# Patient Record
Sex: Female | Born: 1955 | Race: White | Hispanic: No | Marital: Married | Smoking: Former smoker
Health system: Southern US, Community
[De-identification: ages and names within clinical notes are randomized; demographics above are authoritative.]

## PROBLEM LIST (undated history)

## (undated) DIAGNOSIS — G43909 Migraine, unspecified, not intractable, without status migrainosus: Secondary | ICD-10-CM

## (undated) DIAGNOSIS — G459 Transient cerebral ischemic attack, unspecified: Secondary | ICD-10-CM

## (undated) DIAGNOSIS — C50919 Malignant neoplasm of unspecified site of unspecified female breast: Secondary | ICD-10-CM

## (undated) DIAGNOSIS — I10 Essential (primary) hypertension: Secondary | ICD-10-CM

## (undated) DIAGNOSIS — H43399 Other vitreous opacities, unspecified eye: Secondary | ICD-10-CM

## (undated) DIAGNOSIS — D1800 Hemangioma unspecified site: Secondary | ICD-10-CM

## (undated) DIAGNOSIS — K5190 Ulcerative colitis, unspecified, without complications: Secondary | ICD-10-CM

## (undated) DIAGNOSIS — E079 Disorder of thyroid, unspecified: Secondary | ICD-10-CM

## (undated) DIAGNOSIS — R7989 Other specified abnormal findings of blood chemistry: Secondary | ICD-10-CM

## (undated) DIAGNOSIS — G47419 Narcolepsy without cataplexy: Secondary | ICD-10-CM

## (undated) DIAGNOSIS — E785 Hyperlipidemia, unspecified: Secondary | ICD-10-CM

## (undated) DIAGNOSIS — Z923 Personal history of irradiation: Secondary | ICD-10-CM

## (undated) DIAGNOSIS — E538 Deficiency of other specified B group vitamins: Secondary | ICD-10-CM

## (undated) HISTORY — PX: BREAST LUMPECTOMY: SHX2

## (undated) HISTORY — PX: HAND SURGERY: SHX662

## (undated) HISTORY — DX: Hyperlipidemia, unspecified: E78.5

## (undated) HISTORY — PX: ABDOMINAL HERNIA REPAIR: SHX539

## (undated) HISTORY — DX: Essential (primary) hypertension: I10

## (undated) HISTORY — DX: Disorder of thyroid, unspecified: E07.9

## (undated) HISTORY — DX: Hemangioma unspecified site: D18.00

## (undated) HISTORY — PX: ANTERIOR CRUCIATE LIGAMENT REPAIR: SHX115

## (undated) HISTORY — DX: Personal history of irradiation: Z92.3

## (undated) HISTORY — DX: Migraine, unspecified, not intractable, without status migrainosus: G43.909

## (undated) HISTORY — PX: BUNIONECTOMY: SHX129

## (undated) HISTORY — DX: Other vitreous opacities, unspecified eye: H43.399

## (undated) HISTORY — PX: CARPAL TUNNEL WITH CUBITAL TUNNEL: SHX5608

## (undated) HISTORY — PX: CARPAL TUNNEL RELEASE: SHX101

## (undated) HISTORY — PX: DILATION AND CURETTAGE OF UTERUS: SHX78

## (undated) HISTORY — PX: NECK SURGERY: SHX720

## (undated) HISTORY — PX: ACHILLES TENDON LENGTHENING: SUR826

## (undated) HISTORY — DX: Transient cerebral ischemic attack, unspecified: G45.9

## (undated) HISTORY — PX: LIPOMA EXCISION: SHX5283

## (undated) HISTORY — DX: Other specified abnormal findings of blood chemistry: R79.89

## (undated) HISTORY — DX: Ulcerative colitis, unspecified, without complications: K51.90

## (undated) HISTORY — DX: Deficiency of other specified B group vitamins: E53.8

## (undated) HISTORY — PX: LUMBAR LAMINECTOMY: SHX95

## (undated) HISTORY — DX: Malignant neoplasm of unspecified site of unspecified female breast: C50.919

## (undated) HISTORY — DX: Narcolepsy without cataplexy: G47.419

## (undated) HISTORY — PX: BIOPSY THYROID: PRO38

## (undated) HISTORY — PX: HAMMER TOE SURGERY: SHX385

---

## 2015-05-23 LAB — HM COLONOSCOPY: HM COLON: ABNORMAL — AB

## 2015-06-16 LAB — BASIC METABOLIC PANEL
BUN: 28 mg/dL — AB (ref 4–21)
Creatinine: 1.1 mg/dL (ref 0.5–1.1)
Glucose: 91 mg/dL
Potassium: 4.3 mmol/L (ref 3.4–5.3)
SODIUM: 141 mmol/L (ref 137–147)

## 2015-06-16 LAB — LIPID PANEL
CHOLESTEROL: 262 mg/dL — AB (ref 0–200)
HDL: 76 mg/dL — AB (ref 35–70)
LDL CALC: 170 mg/dL
TRIGLYCERIDES: 82 mg/dL (ref 40–160)

## 2015-06-16 LAB — CBC AND DIFFERENTIAL
HCT: 42 % (ref 36–46)
HEMOGLOBIN: 13.8 g/dL (ref 12.0–16.0)
Platelets: 281 10*3/uL (ref 150–399)

## 2015-06-16 LAB — HEPATIC FUNCTION PANEL
ALT: 15 U/L (ref 7–35)
AST: 20 U/L (ref 13–35)
Alkaline Phosphatase: 171 U/L — AB (ref 25–125)

## 2016-02-09 ENCOUNTER — Encounter: Payer: Self-pay | Admitting: *Deleted

## 2016-02-09 ENCOUNTER — Telehealth: Payer: Self-pay | Admitting: *Deleted

## 2016-02-09 NOTE — Telephone Encounter (Signed)
Pre-Visit Call completed with patient and chart updated.   Pre-Visit Info documented in Specialty Comments under Snap[employer]ot.    Whitney Fisher, you may want to look over info prior to appointment.

## 2016-02-10 ENCOUNTER — Encounter: Payer: Self-pay | Admitting: Physician Assistant

## 2016-02-10 ENCOUNTER — Ambulatory Visit (INDEPENDENT_AMBULATORY_CARE_PROVIDER_SITE_OTHER): Payer: Medicare Other | Admitting: Physician Assistant

## 2016-02-10 VITALS — BP 150/90 | HR 74 | Temp 98.0°F | Resp 16 | Ht 64.0 in | Wt 162.1 lb

## 2016-02-10 DIAGNOSIS — K5120 Ulcerative (chronic) proctitis without complications: Secondary | ICD-10-CM

## 2016-02-10 DIAGNOSIS — M150 Primary generalized (osteo)arthritis: Secondary | ICD-10-CM

## 2016-02-10 DIAGNOSIS — Z853 Personal history of malignant neoplasm of breast: Secondary | ICD-10-CM

## 2016-02-10 DIAGNOSIS — K5190 Ulcerative colitis, unspecified, without complications: Secondary | ICD-10-CM

## 2016-02-10 DIAGNOSIS — H539 Unspecified visual disturbance: Secondary | ICD-10-CM | POA: Diagnosis not present

## 2016-02-10 DIAGNOSIS — M159 Polyosteoarthritis, unspecified: Secondary | ICD-10-CM

## 2016-02-10 DIAGNOSIS — E049 Nontoxic goiter, unspecified: Secondary | ICD-10-CM

## 2016-02-10 DIAGNOSIS — Z1283 Encounter for screening for malignant neoplasm of skin: Secondary | ICD-10-CM

## 2016-02-10 DIAGNOSIS — G43709 Chronic migraine without aura, not intractable, without status migrainosus: Secondary | ICD-10-CM

## 2016-02-10 DIAGNOSIS — Z0289 Encounter for other administrative examinations: Secondary | ICD-10-CM

## 2016-02-10 DIAGNOSIS — Z7689 Persons encountering health services in other specified circumstances: Secondary | ICD-10-CM

## 2016-02-10 DIAGNOSIS — I10 Essential (primary) hypertension: Secondary | ICD-10-CM

## 2016-02-10 DIAGNOSIS — G47419 Narcolepsy without cataplexy: Secondary | ICD-10-CM

## 2016-02-10 DIAGNOSIS — E010 Iodine-deficiency related diffuse (endemic) goiter: Secondary | ICD-10-CM

## 2016-02-10 LAB — COMPREHENSIVE METABOLIC PANEL
ALK PHOS: 158 U/L — AB (ref 39–117)
ALT: 13 U/L (ref 0–35)
AST: 15 U/L (ref 0–37)
Albumin: 4.1 g/dL (ref 3.5–5.2)
BILIRUBIN TOTAL: 0.4 mg/dL (ref 0.2–1.2)
BUN: 21 mg/dL (ref 6–23)
CALCIUM: 9.2 mg/dL (ref 8.4–10.5)
CO2: 26 mEq/L (ref 19–32)
Chloride: 107 mEq/L (ref 96–112)
Creatinine, Ser: 0.83 mg/dL (ref 0.40–1.20)
GFR: 74.98 mL/min (ref >60.00)
Glucose, Bld: 102 mg/dL — ABNORMAL HIGH (ref 70–99)
POTASSIUM: 3.8 meq/L (ref 3.5–5.1)
Sodium: 137 mEq/L (ref 135–145)
TOTAL PROTEIN: 7.7 g/dL (ref 6.0–8.3)

## 2016-02-10 LAB — TSH: TSH: 2.49 u[IU]/mL (ref 0.35–4.50)

## 2016-02-10 MED ORDER — BUTALBITAL-APAP-CAFFEINE 50-325-40 MG PO TABS: 1.0000 | ORAL_TABLET | ORAL | Status: DC | PRN

## 2016-02-10 MED ORDER — AMLODIPINE BESYLATE 5 MG PO TABS: 5.0000 mg | ORAL_TABLET | Freq: Every day | ORAL | Status: DC

## 2016-02-10 NOTE — Progress Notes (Signed)
Pre visit review using our clinic review tool, if applicable. No additional management support is needed unless otherwise documented below in the visit note/SLS  

## 2016-02-10 NOTE — Patient Instructions (Signed)
Please go to the lab for blood work. I will call with your results.  Please restart the amlodipine as idrected.  Follow the diet at the bottom of this sheet. Follow-up with me in 3-4 weeks.  You will be contacted by multiple specialists for appointments. You will also be contacted for US of the thyroid as well as mammogram and US of the breast.  DASH Eating Plan DASH stands for "Dietary Approaches to Stop Hypertension." The DASH eating plan is a healthy eating plan that has been shown to reduce high blood pressure (hypertension). Additional health benefits may include reducing the risk of type 2 diabetes mellitus, heart disease, and stroke. The DASH eating plan may also help with weight loss. WHAT DO I NEED TO KNOW ABOUT THE DASH EATING PLAN? For the DASH eating plan, you will follow these general guidelines:  Choose foods with a percent daily value for sodium of less than 5% (as listed on the food label).  Use salt-free seasonings or herbs instead of table salt or sea salt.  Check with your health care provider or pharmacist before using salt substitutes.  Eat lower-sodium products, often labeled as "lower sodium" or "no salt added."  Eat fresh foods.  Eat more vegetables, fruits, and low-fat dairy products.  Choose whole grains. Look for the word "whole" as the first word in the ingredient list.  Choose fish and skinless chicken or [place] more often than red meat. Limit fish, poultry, and meat to 6 oz (170 g) each day.  Limit sweets, desserts, sugars, and sugary drinks.  Choose heart-healthy fats.  Limit cheese to 1 oz (28 g) per day.  Eat more home-cooked food and less restaurant, buffet, and fast food.  Limit fried foods.  Cook foods using methods other than frying.  Limit canned vegetables. If you do use them, rinse them well to decrease the sodium.  When eating at a restaurant, ask that your food be prepared with less salt, or no salt if possible. WHAT FOODS CAN I  EAT? Seek help from a dietitian for individual calorie needs. Grains Whole grain or whole wheat bread. Brown rice. Whole grain or whole wheat pasta. Quinoa, bulgur, and whole grain cereals. Low-sodium cereals. Corn or whole wheat flour tortillas. Whole grain cornbread. Whole grain crackers. Low-sodium crackers. Vegetables Fresh or frozen vegetables (raw, steamed, roasted, or grilled). Low-sodium or reduced-sodium tomato and vegetable juices. Low-sodium or reduced-sodium tomato sauce and paste. Low-sodium or reduced-sodium canned vegetables.  Fruits All fresh, canned (in natural juice), or frozen fruits. Meat and Other Protein Products Ground beef (85% or leaner), grass-fed beef, or beef trimmed of fat. Skinless chicken or [place]. Ground chicken or [place]. Pork trimmed of fat. All fish and seafood. Eggs. Dried beans, peas, or lentils. Unsalted nuts and seeds. Unsalted canned beans. Dairy Low-fat dairy products, such as skim or 1% milk, 2% or reduced-fat cheeses, low-fat ricotta or cottage cheese, or plain low-fat yogurt. Low-sodium or reduced-sodium cheeses. Fats and Oils Tub margarines without trans fats. Light or reduced-fat mayonnaise and salad dressings (reduced sodium). Avocado. Safflower, olive, or canola oils. Natural peanut or almond butter. Other Unsalted popcorn and pretzels. The items listed above may not be a complete list of recommended foods or beverages. Contact your dietitian for more options. WHAT FOODS ARE NOT RECOMMENDED? Grains White bread. White pasta. White rice. Refined cornbread. Bagels and croissants. Crackers that contain trans fat. Vegetables Creamed or fried vegetables. Vegetables in a cheese sauce. Regular canned vegetables. Regular canned tomato sauce  and paste. Regular tomato and vegetable juices. Fruits Dried fruits. Canned fruit in light or heavy syrup. Fruit juice. Meat and Other Protein Products Fatty cuts of meat. Ribs, chicken wings, bacon, sausage,  bologna, salami, chitterlings, fatback, hot dogs, bratwurst, and packaged luncheon meats. Salted nuts and seeds. Canned beans with salt. Dairy Whole or 2% milk, cream, half-and-half, and cream cheese. Whole-fat or sweetened yogurt. Full-fat cheeses or blue cheese. Nondairy creamers and whipped toppings. Processed cheese, cheese spreads, or cheese curds. Condiments Onion and garlic salt, seasoned salt, table salt, and sea salt. Canned and packaged gravies. Worcestershire sauce. Tartar sauce. Barbecue sauce. Teriyaki sauce. Soy sauce, including reduced sodium. Steak sauce. Fish sauce. Oyster sauce. Cocktail sauce. Horseradish. Ketchup and mustard. Meat flavorings and tenderizers. Bouillon cubes. Hot sauce. Tabasco sauce. Marinades. Taco seasonings. Relishes. Fats and Oils Butter, stick margarine, lard, shortening, ghee, and bacon fat. Coconut, palm kernel, or palm oils. Regular salad dressings. Other Pickles and olives. Salted popcorn and pretzels. The items listed above may not be a complete list of foods and beverages to avoid. Contact your dietitian for more information. WHERE CAN I FIND MORE INFORMATION? National Heart, Lung, and Blood Institute: travelstabloid.com   This information is not intended to replace advice given to you by your health care provider. Make sure you discuss any questions you have with your health care provider.   Document Released: 07/28/2011 Document Revised: 08/29/2014 Document Reviewed: 06/12/2013 Elsevier Interactive Patient Education [employer].

## 2016-02-10 NOTE — Progress Notes (Signed)
Patient presents to clinic today to establish care.  Acute Concerns: Patient requests routine referral to Gynecology (routine care) and Dermatology (comprehensive skin examination). Denies concerns at this time.  Chronic Issues: Ulcerative Colitis -- Patient endorses initial diagnosis in 1995. Was previously on Mesalamine in [place] with good relief of symptoms. Has not been on medication in several months before [place] here [place] the states. Endorses no flare up of symptoms since moving and resuming a regular diet. Denies rectal bleeding, mucous stools, unexplained weight loss. Denies N/V, abdomina pain at present. Endorses last colonoscopy in October of 2016 revealing proctitis at the time. No concern for malignancy per patient. Is requesting Gastroenterologist in the area after [place] back [place] the states.  Migraines -- Patient with prior history of daily migraines with aura. Is currently on Topamax 100 mg QD and Effexor 75 mg BID. Endorses sub-therapeutic control of migraines recently but had been out of medication for a couple of weeks while unpacking. Denies symptoms at present. Is requesting referral to Neurology in the area.  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 Denies history of abnormal thyroid function. Was told she had nodules on thyroid confirmed via [place]. Was scheduled for biopsy but did not follow-up. Would like to proceed with assessment now.   Breast Cancer -- Patient endorses remote history of breast cancer in remission for several years. Is overdue for mammogram and would like scheduled. Is also requesting referral to Oncology (Dr. Marin Olp). Denies breast pain or lump in the area. Is s/p lumpectomy of R breast at age 16. Denies history of mastectomy.  Osteoarthritis -- Multiple joints. Long-standing history. Does not like narcotic pain medications. Wants to be set up with a comprehensive pain specialist in the area. Is having prior  records faxed to our office.  Past Medical History:  Diagnosis Date  . B12 deficiency   . Breast cancer ([place])    Right Breast  . Cavernous hemangioma    x2  . Hyperlipidemia   . Hypertension   . Low serum vitamin D   . Migraine   . Narcolepsy   . S/P radiation therapy Age [age]  . Thyroid disease   . TIA (transient ischemic attack)   . Ulcerative colitis ([place])   . Visual floaters     Past Surgical History:  Procedure Laterality Date  . ABDOMINAL HERNIA REPAIR    . ACHILLES TENDON LENGTHENING Left   . ANTERIOR CRUCIATE LIGAMENT REPAIR Right   . BIOPSY THYROID     x2  . BREAST LUMPECTOMY Right Age 56  . BUNIONECTOMY Bilateral   . CARPAL TUNNEL RELEASE Bilateral   . CARPAL TUNNEL WITH CUBITAL TUNNEL Right   . [place]  . DILATION AND CURETTAGE OF UTERUS     After 1st miscarriage  . HAMMER TOE SURGERY Bilateral   . HAND SURGERY Right    Multiple Right & Left tfcc  . LIPOMA EXCISION  Age 75  . LUMBAR LAMINECTOMY  Age 66-30  . NECK SURGERY      Current Outpatient Prescriptions on File Prior to Visit  Medication Sig Dispense Refill  . clopidogrel (PLAVIX) 75 MG tablet Take 75 mg by mouth daily. Reported on 03/03/2016     No current facility-administered medications on file prior to visit.     Allergies  Allergen Reactions  . Codeine Other (See Comments)    Dizziness  . Adhesive [Tape] Rash  . Sulfa Antibiotics Rash  Family History  Problem Relation Age of Onset  . Breast cancer Mother     Living  . Arthritis Mother   . Osteoporosis Mother   . Migraines Mother   . Glaucoma Maternal Grandmother   . Hypertension Father 74    Deceased  . Hyperlipidemia Father   . Parkinson's disease Father   . Parkinsonism Father   . Colon cancer Neg Hx   . Migraines Sister     Social History   Social History  . Marital status: Married    Spouse name: N/A  . Number of children: 2  . Years of education: N/A   Occupational History  . Disabled     Social History Main Topics  . Smoking status: Former Smoker    Types: Cigarettes    Quit date: 08/22/1978  . Smokeless tobacco: Never Used  . Alcohol use 0.0 oz/week     Comment: occ  . Drug use: No  . Sexual activity: Not on file   Other Topics Concern  . Not on file   Social History Narrative  . No narrative on file   ROS  BP (!) 150/90   Pulse 74   Temp 98 F (36.7 C) (Oral)   Resp 16   Ht 5' 4"  (1.626 m)   Wt 162 lb 2 oz (73.5 kg)   SpO2 100%   BMI 27.83 kg/m   Physical Exam  Recent Results (from the past 2160 hour(s))  Comp Met (CMET)     Status: Abnormal   Collection Time: 02/10/16  9:49 AM  Result Value Ref Range   Sodium 137 135 - 145 mEq/L   Potassium 3.8 3.5 - 5.1 mEq/L   Chloride 107 96 - 112 mEq/L   CO2 26 19 - 32 mEq/L   Glucose, Bld 102 (H) 70 - 99 mg/dL   BUN 21 6 - 23 mg/dL   Creatinine, Ser 0.83 0.40 - 1.20 mg/dL   Total Bilirubin 0.4 0.2 - 1.2 mg/dL   Alkaline Phosphatase 158 (H) 39 - 117 U/L   AST 15 0 - 37 U/L   ALT 13 0 - 35 U/L   Total Protein 7.7 6.0 - 8.3 g/dL   Albumin 4.1 3.5 - 5.2 g/dL   Calcium 9.2 8.4 - 10.5 mg/dL   GFR 74.98 >60.00 mL/min  TSH     Status: None   Collection Time: 02/10/16  9:49 AM  Result Value Ref Range   TSH 2.49 0.35 - 4.50 uIU/mL  Comp Met (CMET)     Status: Abnormal   Collection Time: 02/12/16  3:42 PM  Result Value Ref Range   Sodium 142 135 - 146 mmol/L   Potassium 5.1 3.5 - 5.3 mmol/L   Chloride 107 98 - 110 mmol/L   CO2 23 20 - 31 mmol/L   Glucose, Bld 89 65 - 99 mg/dL   BUN 12 7 - 25 mg/dL   Creat 0.82 0.50 - 1.05 mg/dL    Comment:   For patients > or = 58 years of age: The upper reference limit for Creatinine is approximately 13% higher for people identified as African-American.      Total Bilirubin 0.3 0.2 - 1.2 mg/dL   Alkaline Phosphatase 157 (H) 33 - 130 U/L   AST 15 10 - 35 U/L   ALT 13 6 - 29 U/L   Total Protein 7.3 6.1 - 8.1 g/dL   Albumin 4.1 3.6 - 5.1 g/dL   Calcium 9.0 8.6  -  10.4 mg/dL  Lipase     Status: None   Collection Time: 02/12/16  3:42 PM  Result Value Ref Range   Lipase 19 7 - 60 U/L  CBC with Differential/Platelet     Status: None   Collection Time: 02/16/16  4:00 PM  Result Value Ref Range   WBC 8.0 4.0 - 10.5 K/uL   RBC 4.60 3.87 - 5.11 Mil/uL   Hemoglobin 13.1 12.0 - 15.0 g/dL   HCT 39.1 36.0 - 46.0 %   MCV 85.0 78.0 - 100.0 fl   MCHC 33.5 30.0 - 36.0 g/dL   RDW 13.6 11.5 - 15.5 %   Platelets 320.0 150.0 - 400.0 K/uL   Neutrophils Relative % 60.6 43.0 - 77.0 %   Lymphocytes Relative 27.9 12.0 - 46.0 %   Monocytes Relative 6.9 3.0 - 12.0 %   Eosinophils Relative 3.8 0.0 - 5.0 %   Basophils Relative 0.8 0.0 - 3.0 %   Neutro Abs 4.9 1.4 - 7.7 K/uL   Lymphs Abs 2.2 0.7 - 4.0 K/uL   Monocytes Absolute 0.6 0.1 - 1.0 K/uL   Eosinophils Absolute 0.3 0.0 - 0.7 K/uL   Basophils Absolute 0.1 0.0 - 0.1 K/uL  Sed Rate (ESR)     Status: None   Collection Time: 02/16/16  4:00 PM  Result Value Ref Range   Sed Rate 8 0 - 30 mm/hr  C-reactive protein     Status: Abnormal   Collection Time: 02/16/16  4:00 PM  Result Value Ref Range   CRP 0.1 (L) 0.5 - 20.0 mg/dL  Gamma GT     Status: None   Collection Time: 02/16/16  4:00 PM  Result Value Ref Range   GGT 27 7 - 51 U/L  VITAMIN D 25 Hydroxy (Vit-D Deficiency, Fractures)     Status: None   Collection Time: 02/18/16  3:22 PM  Result Value Ref Range   VITD 31.40 30.00 - 100.00 ng/mL  Cytology - PAP     Status: None   Collection Time: 03/03/16 12:00 AM  Result Value Ref Range   CYTOLOGY - PAP PAP RESULT     Assessment/Plan: Chronic migraine without aura without status migrainosus, not intractable Referral to Neurology placed. Medications refilled. She is to continue her current regimen as she recently restarted after finding medication in moving boxes.   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 Referral to Gastroenterology placed for management and to set up schedule for  repeat colonoscopies.  Thyromegaly No palpable nodules on examination. Will check thyroid function and obtain US thyroid. Will then proceed with biopsy if indicated.  Primary osteoarthritis involving multiple joints Rx Tramadol for breakthrough pain. OTC medications reviewed. Supportive measures and water aerobics discussed. Referral to pain specialist placed.  Skin exam for malignant neoplasm Routine referral to Dermatology placed.  Referral of patient Referral to Gynecology for routine care placed.  History of breast cancer Referral to oncology placed. Will obtain imaging of breasts for surveillance. Orders placed.    Leeanne Rio, PA-C

## 2016-02-12 ENCOUNTER — Ambulatory Visit (INDEPENDENT_AMBULATORY_CARE_PROVIDER_SITE_OTHER): Payer: Medicare Other | Admitting: Physician Assistant

## 2016-02-12 ENCOUNTER — Other Ambulatory Visit: Payer: Self-pay | Admitting: Physician Assistant

## 2016-02-12 ENCOUNTER — Ambulatory Visit (HOSPITAL_BASED_OUTPATIENT_CLINIC_OR_DEPARTMENT_OTHER)
Admission: RE | Admit: 2016-02-12 | Discharge: 2016-02-12 | Disposition: A | Discharge status: Home / Self Care | Payer: Medicare Other | Source: Ambulatory Visit | Attending: Physician Assistant | Admitting: Physician Assistant

## 2016-02-12 ENCOUNTER — Encounter: Payer: Self-pay | Admitting: Physician Assistant

## 2016-02-12 VITALS — BP 151/98 | HR 70 | Temp 98.2°F | Resp 16 | Ht 64.0 in | Wt 163.0 lb

## 2016-02-12 DIAGNOSIS — K769 Liver disease, unspecified: Secondary | ICD-10-CM | POA: Diagnosis not present

## 2016-02-12 DIAGNOSIS — R748 Abnormal levels of other serum enzymes: Secondary | ICD-10-CM

## 2016-02-12 DIAGNOSIS — R1013 Epigastric pain: Secondary | ICD-10-CM

## 2016-02-12 DIAGNOSIS — R140 Abdominal distension (gaseous): Secondary | ICD-10-CM | POA: Diagnosis not present

## 2016-02-12 LAB — COMPREHENSIVE METABOLIC PANEL
ALBUMIN: 4.1 g/dL (ref 3.6–5.1)
ALK PHOS: 157 U/L — AB (ref 33–130)
ALT: 13 U/L (ref 6–29)
AST: 15 U/L (ref 10–35)
BILIRUBIN TOTAL: 0.3 mg/dL (ref 0.2–1.2)
BUN: 12 mg/dL (ref 7–25)
CALCIUM: 9 mg/dL (ref 8.6–10.4)
CO2: 23 mmol/L (ref 20–31)
Chloride: 107 mmol/L (ref 98–110)
Creat: 0.82 mg/dL (ref 0.50–1.05)
Glucose, Bld: 89 mg/dL (ref 65–99)
Potassium: 5.1 mmol/L (ref 3.5–5.3)
Sodium: 142 mmol/L (ref 135–146)
Total Protein: 7.3 g/dL (ref 6.1–8.1)

## 2016-02-12 LAB — LIPASE: Lipase: 19 U/L (ref 7–60)

## 2016-02-12 MED ORDER — ROSUVASTATIN CALCIUM 10 MG PO TABS: 10.0000 mg | ORAL_TABLET | Freq: Every day | ORAL | Status: DC

## 2016-02-12 MED ORDER — VENLAFAXINE HCL 75 MG PO TABS: 75.0000 mg | ORAL_TABLET | Freq: Two times a day (BID) | ORAL | Status: DC

## 2016-02-12 MED ORDER — TOPIRAMATE 100 MG PO TABS: 100.0000 mg | ORAL_TABLET | Freq: Two times a day (BID) | ORAL | Status: DC

## 2016-02-12 NOTE — Patient Instructions (Addendum)
Please go to the lab for blood work. Then go downstairs for imaging.   I will call you with your results. They will call me with the [place] results before letting you leave the imaging department.  Stay hydrated and eat a bland diet. Tylenol for pain. If anything acutely worsens, please go to the ER.

## 2016-02-12 NOTE — Progress Notes (Signed)
Patient presents to clinic today for follow-up of alkaline phosphatase elevation. Does endorse some epigastric and RUQ discomfort over the past several months, but denies pain. Notes intermittent nausea but denies emesis. Denies change to bowel or urinary habits.  Denies fever, chills, malaise or fatigue. Denies history of gallstones or biliary issue.  Past Medical History  Diagnosis Date  . Ulcerative colitis ([place])   . Migraine   . S/P radiation therapy Age 20-48  . Hypertension   . Narcolepsy   . TIA (transient ischemic attack)   . Breast cancer ([place])   . History of breast cancer   . Hyperlipidemia   . Low serum vitamin D   . B12 deficiency   . Thyroid disease   . Cavernous hemangioma   . Visual floaters     Current Outpatient Prescriptions on File Prior to Visit  Medication Sig Dispense Refill  . amLODipine (NORVASC) 5 MG tablet Take 1 tablet (5 mg total) by mouth daily. Reported on 02/10/2016 30 tablet 1  . butalbital-acetaminophen-caffeine (FIORICET, ESGIC) 50-325-40 MG tablet Take 1 tablet by mouth as needed for headache. 20 tablet 0  . clopidogrel (PLAVIX) 75 MG tablet Take 75 mg by mouth daily. Reported on 02/10/2016    . Mesalamine (ASACOL PO) Take by mouth 2 (two) times daily.    . rosuvastatin (CRESTOR) 10 MG tablet Take 10 mg by mouth daily. Reported on 02/10/2016    . topiramate (TOPAMAX) 100 MG tablet Take 100 mg by mouth 2 (two) times daily.    Marland Kitchen venlafaxine (EFFEXOR) 75 MG tablet Take 75 mg by mouth 2 (two) times daily.     No current facility-administered medications on file prior to visit.    Allergies  Allergen Reactions  . Codeine Other (See Comments)    Dizziness  . Adhesive [Tape] Rash  . Sulfa Antibiotics Rash    Family History  Problem Relation Age of Onset  . Breast cancer Mother     Living  . Arthritis Mother   . Osteoporosis Mother   . Glaucoma Maternal Grandmother   . Hypertension Father 52    Deceased  . Hyperlipidemia Father   .  Parkinson's disease Father     Social History   Social History  . Marital Status: Married    Spouse Name: N/A  . Number of Children: N/A  . Years of Education: N/A   Social History Main Topics  . Smoking status: Former Smoker    Quit date: 08/22/1978  . Smokeless tobacco: None  . Alcohol Use: None  . Drug Use: None  . Sexual Activity: Not Asked   Other Topics Concern  . None   Social History Narrative   Review of Systems - See HPI.  All other ROS are negative.  BP 151/98 mmHg  Pulse 70  Temp(Src) 98.2 F (36.8 C) (Oral)  Resp 16  Ht 5' 4"  (1.626 m)  Wt 163 lb (73.936 kg)  BMI 27.97 kg/m2  SpO2 100%  Physical Exam  Constitutional: She is oriented to person, place, and time and well-developed, well-nourished, and in no distress.  HENT:  Head: Normocephalic and atraumatic.  Eyes: Conjunctivae are normal.  Neck: Neck supple.  Cardiovascular: Normal rate, regular rhythm, normal heart sounds and intact distal pulses.   Pulmonary/Chest: Effort normal and breath sounds normal. No respiratory distress. She has no wheezes. She has no rales. She exhibits no tenderness.  Abdominal: Soft. Bowel sounds are normal. She exhibits no distension and no mass. There  is no hepatosplenomegaly. There is tenderness in the epigastric area. There is no rebound, no guarding and negative Murphy's sign. No hernia.  Neurological: She is alert and oriented to person, place, and time.  Skin: Skin is warm and dry. No rash noted.  Psychiatric: Affect normal.  Vitals reviewed.   Recent Results (from the past 2160 hour(s))  Comp Met (CMET)     Status: Abnormal   Collection Time: 02/10/16  9:49 AM  Result Value Ref Range   Sodium 137 135 - 145 mEq/L   Potassium 3.8 3.5 - 5.1 mEq/L   Chloride 107 96 - 112 mEq/L   CO2 26 19 - 32 mEq/L   Glucose, Bld 102 (H) 70 - 99 mg/dL   BUN 21 6 - 23 mg/dL   Creatinine, Ser 0.83 0.40 - 1.20 mg/dL   Total Bilirubin 0.4 0.2 - 1.2 mg/dL   Alkaline Phosphatase  158 (H) 39 - 117 U/L   AST 15 0 - 37 U/L   ALT 13 0 - 35 U/L   Total Protein 7.7 6.0 - 8.3 g/dL   Albumin 4.1 3.5 - 5.2 g/dL   Calcium 9.2 8.4 - 10.5 mg/dL   GFR 74.98 >60.00 mL/min  TSH     Status: None   Collection Time: 02/10/16  9:49 AM  Result Value Ref Range   TSH 2.49 0.35 - 4.50 uIU/mL    Assessment/Plan: 1. Epigastric discomfort Will check lab panel today. STAT US ordered for further assessment of symptoms. Question some biliary dysfunction as her history, time frame and current symptoms do not fit a typical acute cholecystitis or choledocolithiasis. Supportive measures reviewed.  - US Abdomen Limited RUQ; Future - Comp Met (CMET) - Lipase  2. Elevated alkaline phosphatase level Will check labs and STAT US today giving she has had some mild pain for several months. No alarm signs/symptoms present but these have been reviewed with patient. ER if these occur before workup is complete. - US Abdomen Limited RUQ; Future - Comp Met (CMET) - Lipase   Leeanne Rio, PA-C

## 2016-02-15 ENCOUNTER — Encounter: Payer: Self-pay | Admitting: *Deleted

## 2016-02-15 ENCOUNTER — Telehealth: Payer: Self-pay | Admitting: *Deleted

## 2016-02-15 DIAGNOSIS — R748 Abnormal levels of other serum enzymes: Secondary | ICD-10-CM

## 2016-02-15 DIAGNOSIS — R1013 Epigastric pain: Secondary | ICD-10-CM

## 2016-02-15 NOTE — Telephone Encounter (Signed)
Patient informed [explained hemangiomas and reasoning for GTT lab test], understood & agreed order placed for MRI and future lab orders placed, pt scheduled for lab on Thurs, 02/18/16 at 8:45am/SLS 06/26

## 2016-02-15 NOTE — Telephone Encounter (Signed)
-----  Message from Brunetta Jeans, PA-C sent at 02/13/2016  7:53 AM EDT ----- Please inform patient that her alk phos is still elevated but is unchanged from last check. Again as discussed with her Friday evening, her [place] was unremarkable for gallbladder or biliary abnormality. Did show likely hemangiomas which are not harmful. We can get an MRI to further assess if she would like.  Giving the persistent elevation inf her enzyme but unremarkable [place], would recommend she come in for a GGT level. This will help me differentiate if this elevation is still related to the liver and biliary tract or for another reason (bone-related). Would also like to check a Vitamin D level on her.  Please schedule her a lab appointment. Also verify that she received her prescriptions this weekend -- sent in a couple and phoned in an Rx for Tramadol for her Friday evening. Is symptoms are becoming more significant, she will need to see one of my colleagues in office this week.

## 2016-02-16 ENCOUNTER — Encounter: Payer: Self-pay | Admitting: Gastroenterology

## 2016-02-16 ENCOUNTER — Other Ambulatory Visit (INDEPENDENT_AMBULATORY_CARE_PROVIDER_SITE_OTHER): Payer: Medicare Other

## 2016-02-16 ENCOUNTER — Telehealth: Payer: Self-pay | Admitting: *Deleted

## 2016-02-16 ENCOUNTER — Ambulatory Visit (INDEPENDENT_AMBULATORY_CARE_PROVIDER_SITE_OTHER): Payer: Medicare Other | Admitting: Gastroenterology

## 2016-02-16 VITALS — BP 124/70 | HR 72 | Ht 63.0 in | Wt 161.0 lb

## 2016-02-16 DIAGNOSIS — K5120 Ulcerative (chronic) proctitis without complications: Secondary | ICD-10-CM | POA: Insufficient documentation

## 2016-02-16 DIAGNOSIS — R748 Abnormal levels of other serum enzymes: Secondary | ICD-10-CM

## 2016-02-16 DIAGNOSIS — R1013 Epigastric pain: Secondary | ICD-10-CM

## 2016-02-16 LAB — CBC WITH DIFFERENTIAL/PLATELET
BASOS ABS: 0.1 10*3/uL (ref 0.0–0.1)
Basophils Relative: 0.8 % (ref 0.0–3.0)
EOS ABS: 0.3 10*3/uL (ref 0.0–0.7)
Eosinophils Relative: 3.8 % (ref 0.0–5.0)
HEMATOCRIT: 39.1 % (ref 36.0–46.0)
HEMOGLOBIN: 13.1 g/dL (ref 12.0–15.0)
LYMPHS PCT: 27.9 % (ref 12.0–46.0)
Lymphs Abs: 2.2 10*3/uL (ref 0.7–4.0)
MCHC: 33.5 g/dL (ref 30.0–36.0)
MCV: 85 fl (ref 78.0–100.0)
MONO ABS: 0.6 10*3/uL (ref 0.1–1.0)
Monocytes Relative: 6.9 % (ref 3.0–12.0)
Neutro Abs: 4.9 10*3/uL (ref 1.4–7.7)
Neutrophils Relative %: 60.6 % (ref 43.0–77.0)
Platelets: 320 10*3/uL (ref 150.0–400.0)
RBC: 4.6 Mil/uL (ref 3.87–5.11)
RDW: 13.6 % (ref 11.5–15.5)
WBC: 8 10*3/uL (ref 4.0–10.5)

## 2016-02-16 LAB — SEDIMENTATION RATE: SED RATE: 8 mm/h (ref 0–30)

## 2016-02-16 LAB — GAMMA GT: GGT: 27 U/L (ref 7–51)

## 2016-02-16 LAB — C-REACTIVE PROTEIN: CRP: 0.1 mg/dL — AB (ref 0.5–20.0)

## 2016-02-16 MED ORDER — MESALAMINE 1.2 G PO TBEC: 2.4000 g | DELAYED_RELEASE_TABLET | Freq: Every day | ORAL | Status: AC

## 2016-02-16 MED ORDER — MESALAMINE 4 G RE ENEM: ENEMA | RECTAL | Status: AC

## 2016-02-16 MED ORDER — ONDANSETRON HCL 4 MG PO TABS: 4.0000 mg | ORAL_TABLET | Freq: Four times a day (QID) | ORAL | Status: AC | PRN

## 2016-02-16 NOTE — Progress Notes (Signed)
HPI :  60 y/o female with a history of UC, here for a new patient evaluation. From review of records, prior endoscopic evaluation has showed proctitis /  Left sided colitis. She is taking plavix for history of TIA, she has been off of it for several weeks in the transition of her move. She also has a history of breast cancer.   She thinks she was diagnosed around 60 or so. She thinks she had rectal bleeding / diarrhea at the time, weight loss. She had been on Asacol for many years, she thinks it worked okay for her in general. She states she had some flares at times over the years, treated mostly with enemas. She has had periodic flares over the years, but she does not think she has required steroids in the past. She has used mesalamine enemas for flares. She has never been hospitalizaed in the past for this year. She has never been on thiopurines or anti-TNF in the past.   She thinks the most recent medication she was on for this was Rafassal (mesalamine based) which was given to her in the middle [place]. She reports in general it had been working well for her. She has not been on medication for over a month at this time, she moved from the [place], living in [place] since April. She has felt weak more recently and run down. She thinks the mesalamine she had there made her nauseated.  She reports having roughly a few bowel movements in the morning, and perhaps another later in the day. She thinks she got food poisoning from eating bad soup yesterday, with vomiting and abdominal pains. She has blood in the stools that is rare, not frequently. She has had some cramping recently in her mid upper abdomen and sides. She has also had some going nausea for many weeks.  She has had a Colonoscopy in October 2016 in [place] which she thinks showed mild inflammation but nothing significant.   Of note, the patient is noted to have an elevated AP on review of labs with normal ALT. US showed suspected  hemangiomas, she has a pending MRI liver as ordered per primary care.  Prior endoscopic evaluation brought in records: Flex sig 2011 - moderate inflammation up to 20cm from anal verge, path shows severe chronic active colitis Colonoscopy 2010 - normal ileum, normal right and transverse colon, rectal inflammation - path c/w chronic proctitis  Past Medical History  Diagnosis Date  . Ulcerative colitis ([place])   . Migraine   . S/P radiation therapy Age [age]  . Hypertension   . Narcolepsy   . TIA (transient ischemic attack)   . Breast cancer ([place])     Right Breast  . Hyperlipidemia   . Low serum vitamin D   . B12 deficiency   . Thyroid disease   . Cavernous hemangioma     x2  . Visual floaters      Past Surgical History  Procedure Laterality Date  . Breast lumpectomy Right Age 32  . Carpal tunnel release Bilateral   . Carpal tunnel with cubital tunnel Right   . Neck surgery    . Hand surgery Right     Multiple Right & Left tfcc  . Anterior cruciate ligament repair Right   . Lumbar laminectomy  Age 60-30  . Bunionectomy Bilateral   . Hammer toe surgery Bilateral   . Achilles tendon lengthening Left   . Lipoma excision  Age 29  . Dilation and  curettage of uterus      After 1st miscarriage  . Cesarean section  1989, 1991  . Biopsy thyroid      x2  . Abdominal hernia repair     Family History  Problem Relation Age of Onset  . Breast cancer Mother     Living  . Arthritis Mother   . Osteoporosis Mother   . Glaucoma Maternal Grandmother   . Hypertension Father 38    Deceased  . Hyperlipidemia Father   . Parkinson's disease Father   . Colon cancer Neg Hx    Social History  Substance Use Topics  . Smoking status: Former Smoker    Types: Cigarettes    Quit date: 08/22/1978  . Smokeless tobacco: Never Used  . Alcohol Use: 0.0 oz/week    0 Standard drinks or equivalent per week     Comment: occ   Current Outpatient Prescriptions  Medication Sig Dispense Refill    . amLODipine (NORVASC) 5 MG tablet Take 1 tablet (5 mg total) by mouth daily. Reported on 02/10/2016 30 tablet 1  . butalbital-acetaminophen-caffeine (FIORICET, ESGIC) 50-325-40 MG tablet Take 1 tablet by mouth as needed for headache. 20 tablet 0  . clopidogrel (PLAVIX) 75 MG tablet Take 75 mg by mouth daily. Reported on 02/10/2016    . rosuvastatin (CRESTOR) 10 MG tablet Take 1 tablet (10 mg total) by mouth daily. Reported on 02/10/2016 30 tablet 0  . topiramate (TOPAMAX) 100 MG tablet Take 1 tablet (100 mg total) by mouth 2 (two) times daily. 60 tablet 2  . venlafaxine (EFFEXOR) 75 MG tablet Take 1 tablet (75 mg total) by mouth 2 (two) times daily. 60 tablet 2   No current facility-administered medications for this visit.   Allergies  Allergen Reactions  . Codeine Other (See Comments)    Dizziness  . Adhesive [Tape] Rash  . Sulfa Antibiotics Rash     Review of Systems: All systems reviewed and negative except where noted in HPI.    US Abdomen Limited Ruq  02/12/2016  CLINICAL DATA:  Epigastric pain and bloating for 1 week EXAM: US ABDOMEN LIMITED - RIGHT UPPER QUADRANT COMPARISON:  None. FINDINGS: Gallbladder: No gallstones or wall thickening visualized. No sonographic Murphy sign noted by sonographer. Common bile duct: Diameter: 3.9 mm in diameter within normal limits. Liver: There is hyperechoic lesion in left hepatic lobe measures 1.1 x 1.1 cm. Hyperechoic lesion in right lobe laterally measures 7.5 by 6.3 mm. There is a hyperechoic lesion in anterior aspect of right hepatic lobe measures 9 x 9 mm. Statistically this most likely represent hemangiomas. Confirmation with enhanced CT scan or MRI could be performed. IMPRESSION: 1. No gallstones are noted within gallbladder. Normal CBD. There is hyperechoic lesion in left hepatic lobe measures 1.1 x 1.1 cm. Hyperechoic lesion in right lobe laterally measures 7.5 by 6.3 mm. There is a hyperechoic lesion in anterior aspect of right hepatic lobe  measures 9 x 9 mm. Statistically this most likely represent hemangiomas. Confirmation with enhanced CT scan or MRI could be performed. Electronically Signed   By: Lahoma Crocker M.D.   On: 02/12/2016 18:27   No results found for: WBC, HGB, HCT, MCV, PLT  Lab Results  Component Value Date   CREATININE 0.82 02/12/2016   BUN 12 02/12/2016   NA 142 02/12/2016   K 5.1 02/12/2016   CL 107 02/12/2016   CO2 23 02/12/2016    Lab Results  Component Value Date   ALT 13 02/12/2016  AST 15 02/12/2016   ALKPHOS 157* 02/12/2016   BILITOT 0.3 02/12/2016       Physical Exam: BP 124/70 mmHg  Pulse 72  Ht 5' 3"  (1.6 m)  Wt 161 lb (73.029 kg)  BMI 28.53 kg/m2 Constitutional: Pleasant,well-developed, female in no acute distress. HEENT: Normocephalic and atraumatic. Conjunctivae are normal. No scleral icterus. Neck supple.  Cardiovascular: Normal rate, regular rhythm.  Pulmonary/chest: Effort normal and breath sounds normal. No wheezing, rales or rhonchi. Abdominal: Soft, nondistended, nontender. Bowel sounds active throughout. There are no masses palpable. No hepatomegaly. Extremities: no edema Lymphadenopathy: No cervical adenopathy noted. Neurological: Alert and oriented to person place and time. Skin: Skin is warm and dry. No rashes noted. Psychiatric: Normal mood and affect. Behavior is normal.   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 Historically controlled with mesalamine over the years (oral and rectal) without needing steroids and no prior hospitalizations. She has not been feeling well for the past month or so, and has ran out of her medications during this time of her move back to this area. Recommend obtaining baseline CBC, ESR, and CRP today to ensure stable. We will otherwise start Lialda 2.4gm daily and will provide Rowasa enemas to use PRN. I also will provide some Zofran to use PRN for her nausea. We  discussed goals of care, to include maintaining mucosal healing / deep remission. Last colonoscopy done in 05/2015 which reportedly looked okay but no report available. We will await her course on this regimen, if no improvement I asked her to contact me for reassessment.   Regarding elevated AP, her ALT and AST are normal. Will send GGT to confirm if this is hepatic in etiology. If the GGT is elevated, I would recommend changing the currently scheduled MRI liver to MRI with MRCP to ensure no evidence of PSC, which she is at risk for given her UC history. Otherwise agreed with MRI liver as previously ordered to confirm findings on [place] of suspected hepatic hemangioma, which I discussed with the patient, and is benign. If her imaging is otherwise normal and AP elevation persists, may consider further additional lab workup. She agreed.   [place] Cellar, MD [facility] Pager [phone]  CC: Brunetta Jeans, PA-C

## 2016-02-16 NOTE — Telephone Encounter (Signed)
FW: mri  Received: Today    Halfway            See below, change you change order? Thanks       Previous Messages     ----- Message -----   From: Katha Hamming   Sent: 02/16/2016  7:32 AM    To: Synthia Innocent  Subject: mri                        Good Morning:   The mri order should be MRI abd w/wo.    Thanks,  Hale Drone with [facility] in [place], she had to go through my original order of MRI ABD with in her system to locate MRI ABD with & without and make the change; she will let Hoyle Sauer know for scheduling, I will let Anderson Malta know for Pre-cert/SLS [id number]

## 2016-02-16 NOTE — Patient Instructions (Signed)
Go to the basement today for labs We have sent Lialda,Rowasa enemas, and Zofran to your pharmacy

## 2016-02-17 DIAGNOSIS — H2513 Age-related nuclear cataract, bilateral: Secondary | ICD-10-CM | POA: Diagnosis not present

## 2016-02-17 DIAGNOSIS — H52223 Regular astigmatism, bilateral: Secondary | ICD-10-CM | POA: Diagnosis not present

## 2016-02-17 DIAGNOSIS — G43809 Other migraine, not intractable, without status migrainosus: Secondary | ICD-10-CM | POA: Diagnosis not present

## 2016-02-17 DIAGNOSIS — H04123 Dry eye syndrome of bilateral lacrimal glands: Secondary | ICD-10-CM | POA: Diagnosis not present

## 2016-02-18 ENCOUNTER — Other Ambulatory Visit (INDEPENDENT_AMBULATORY_CARE_PROVIDER_SITE_OTHER): Payer: Medicare Other

## 2016-02-18 DIAGNOSIS — K5120 Ulcerative (chronic) proctitis without complications: Secondary | ICD-10-CM

## 2016-02-19 ENCOUNTER — Encounter: Payer: Self-pay | Admitting: Physician Assistant

## 2016-02-19 ENCOUNTER — Ambulatory Visit (HOSPITAL_BASED_OUTPATIENT_CLINIC_OR_DEPARTMENT_OTHER)
Admission: RE | Admit: 2016-02-19 | Discharge: 2016-02-19 | Disposition: A | Discharge status: Home / Self Care | Payer: Medicare Other | Source: Ambulatory Visit | Attending: Physician Assistant | Admitting: Physician Assistant

## 2016-02-19 DIAGNOSIS — E049 Nontoxic goiter, unspecified: Secondary | ICD-10-CM | POA: Insufficient documentation

## 2016-02-19 DIAGNOSIS — E041 Nontoxic single thyroid nodule: Secondary | ICD-10-CM | POA: Diagnosis not present

## 2016-02-19 DIAGNOSIS — E010 Iodine-deficiency related diffuse (endemic) goiter: Secondary | ICD-10-CM

## 2016-02-19 LAB — VITAMIN D 25 HYDROXY (VIT D DEFICIENCY, FRACTURES): VITD: 31.4 ng/mL (ref 30.00–100.00)

## 2016-02-20 ENCOUNTER — Ambulatory Visit (HOSPITAL_BASED_OUTPATIENT_CLINIC_OR_DEPARTMENT_OTHER)
Admission: RE | Admit: 2016-02-20 | Discharge: 2016-02-20 | Disposition: A | Discharge status: Home / Self Care | Payer: Medicare Other | Source: Ambulatory Visit | Attending: Physician Assistant | Admitting: Physician Assistant

## 2016-02-20 DIAGNOSIS — R1013 Epigastric pain: Secondary | ICD-10-CM

## 2016-02-20 DIAGNOSIS — K769 Liver disease, unspecified: Secondary | ICD-10-CM | POA: Insufficient documentation

## 2016-02-20 DIAGNOSIS — N261 Atrophy of kidney (terminal): Secondary | ICD-10-CM | POA: Insufficient documentation

## 2016-02-20 DIAGNOSIS — R748 Abnormal levels of other serum enzymes: Secondary | ICD-10-CM | POA: Insufficient documentation

## 2016-02-20 DIAGNOSIS — D1809 Hemangioma of other sites: Secondary | ICD-10-CM | POA: Diagnosis not present

## 2016-02-20 MED ORDER — GADOBENATE DIMEGLUMINE 529 MG/ML IV SOLN: 14.0000 mL/kg | Freq: Once | INTRAVENOUS | Status: DC | PRN

## 2016-02-20 MED ORDER — GADOBENATE DIMEGLUMINE 529 MG/ML IV SOLN
14.0000 mL | Freq: Once | INTRAVENOUS | Status: AC | PRN
  Administered 2016-02-20: 14 mL via INTRAVENOUS

## 2016-02-22 ENCOUNTER — Other Ambulatory Visit: Payer: Self-pay | Admitting: Physician Assistant

## 2016-02-22 DIAGNOSIS — Z853 Personal history of malignant neoplasm of breast: Secondary | ICD-10-CM

## 2016-02-24 ENCOUNTER — Ambulatory Visit (INDEPENDENT_AMBULATORY_CARE_PROVIDER_SITE_OTHER): Payer: Medicare Other | Admitting: Physician Assistant

## 2016-02-24 VITALS — BP 124/84 | HR 68 | Temp 97.9°F | Resp 16 | Ht 63.0 in | Wt 161.4 lb

## 2016-02-24 DIAGNOSIS — D1803 Hemangioma of intra-abdominal structures: Secondary | ICD-10-CM

## 2016-02-24 DIAGNOSIS — E041 Nontoxic single thyroid nodule: Secondary | ICD-10-CM | POA: Diagnosis not present

## 2016-02-24 DIAGNOSIS — R748 Abnormal levels of other serum enzymes: Secondary | ICD-10-CM | POA: Diagnosis not present

## 2016-02-24 NOTE — Patient Instructions (Signed)
Please start a daily probiotic -- I recommend align or digestive advantage, but honestly whatever is on sale works! Take as directed.  For the arm -- limit heavy lifting. Take your Tramadol as directed. Apply topical Icy Hot or Salon Pas to the area. Follow-up if not resolving.  You will be contacted for a thyroid biopsy and an appointment with Endocrinology.  Follow-up with me in 1 month.

## 2016-02-28 DIAGNOSIS — R748 Abnormal levels of other serum enzymes: Secondary | ICD-10-CM | POA: Insufficient documentation

## 2016-02-28 DIAGNOSIS — D1803 Hemangioma of intra-abdominal structures: Secondary | ICD-10-CM | POA: Insufficient documentation

## 2016-02-28 DIAGNOSIS — E041 Nontoxic single thyroid nodule: Secondary | ICD-10-CM | POA: Insufficient documentation

## 2016-02-28 NOTE — Assessment & Plan Note (Signed)
Workup thus far unremarkable. Vitamin D level to be checked. Referral to Endocrinology placed for further assessment.

## 2016-02-28 NOTE — Assessment & Plan Note (Signed)
Will order US guided biopsy. Patient has been referred to Endocrinology.

## 2016-02-28 NOTE — Assessment & Plan Note (Signed)
Noted on MRI 02/2016. Discussed typical benign nature of lesions. FU with GI as scheduled.

## 2016-02-28 NOTE — Progress Notes (Signed)
Patient presents to clinic today for further discussion of recent results.   Patient with elevated alk phos noted on lab work at initial visit. Repeat alk phos remained elevated at 157. GGT obtained and normal concerning for extra hepatic cause of elevation. MRI Abdomen negative for acute concerns. Did reveals several small benign hemangiomas. Patient has followed with Gastroenterology due to history of UC. Is stable on Lialda. GI recommended Endocrinology referral due to Alk Phos elevation. I am in agreement. Patient would like referral.   Patient also here to review recent thyroid US results. US revealed " Normal-sized thyroid with solitary 1.8 cm left nodule. Findings meet consensus criteria for biopsy, if not already performed, as per the consensus statement: Management of Thyroid Nodules Detected at [place]: Society of Radiologists in [place]. Radiology 2005; 573:220-254"    Past Medical History  Diagnosis Date  . Ulcerative colitis ([place])   . Migraine   . S/P radiation therapy Age 15-48  . Hypertension   . Narcolepsy   . TIA (transient ischemic attack)   . Breast cancer ([place])     Right Breast  . Hyperlipidemia   . Low serum vitamin D   . B12 deficiency   . Thyroid disease   . Cavernous hemangioma     x2  . Visual floaters     Current Outpatient Prescriptions on File Prior to Visit  Medication Sig Dispense Refill  . amLODipine (NORVASC) 5 MG tablet Take 1 tablet (5 mg total) by mouth daily. Reported on 02/10/2016 30 tablet 1  . butalbital-acetaminophen-caffeine (FIORICET, ESGIC) 50-325-40 MG tablet Take 1 tablet by mouth as needed for headache. 20 tablet 0  . mesalamine (LIALDA) 1.2 g EC tablet Take 2 tablets (2.4 g total) by mouth daily with breakfast. 120 tablet 3  . mesalamine (ROWASA) 4 g enema Every night at bedtime (Patient taking differently: at bedtime as needed. ) 14 Bottle 3  . ondansetron (ZOFRAN) 4 MG tablet Take 1 tablet (4 mg  total) by mouth every 6 (six) hours as needed for nausea or vomiting. 30 tablet 1  . rosuvastatin (CRESTOR) 10 MG tablet Take 1 tablet (10 mg total) by mouth daily. Reported on 02/10/2016 30 tablet 0  . topiramate (TOPAMAX) 100 MG tablet Take 1 tablet (100 mg total) by mouth 2 (two) times daily. 60 tablet 2  . venlafaxine (EFFEXOR) 75 MG tablet Take 1 tablet (75 mg total) by mouth 2 (two) times daily. 60 tablet 2  . clopidogrel (PLAVIX) 75 MG tablet Take 75 mg by mouth daily. Reported on 02/24/2016     No current facility-administered medications on file prior to visit.    Allergies  Allergen Reactions  . Codeine Other (See Comments)    Dizziness  . Adhesive [Tape] Rash  . Sulfa Antibiotics Rash    Family History  Problem Relation Age of Onset  . Breast cancer Mother     Living  . Arthritis Mother   . Osteoporosis Mother   . Glaucoma Maternal Grandmother   . Hypertension Father 38    Deceased  . Hyperlipidemia Father   . Parkinson's disease Father   . Colon cancer Neg Hx     Social History   Social History  . Marital Status: Married    Spouse Name: N/A  . Number of Children: 2  . Years of Education: N/A   Occupational History  . Disabled    Social History Main Topics  . Smoking status: Former Smoker  Types: Cigarettes    Quit date: 08/22/1978  . Smokeless tobacco: Never Used  . Alcohol Use: 0.0 oz/week    0 Standard drinks or equivalent per week     Comment: occ  . Drug Use: No  . Sexual Activity: Not on file   Other Topics Concern  . Not on file   Social History Narrative    Review of Systems - See HPI.  All other ROS are negative.  BP 124/84 mmHg  Pulse 68  Temp(Src) 97.9 F (36.6 C) (Oral)  Resp 16  Ht 5' 3"  (1.6 m)  Wt 161 lb 6 oz (73.199 kg)  BMI 28.59 kg/m2  SpO2 98%  Physical Exam  Constitutional: She is well-developed, well-nourished, and in no distress.  HENT:  Head: Normocephalic and atraumatic.  Neck: Neck supple.  Cardiovascular:  Normal rate, regular rhythm, normal heart sounds and intact distal pulses.   Pulmonary/Chest: Effort normal and breath sounds normal. No respiratory distress. She has no wheezes. She has no rales. She exhibits no tenderness.  Abdominal: Soft. Bowel sounds are normal.  Skin: Skin is warm and dry. No rash noted.  Vitals reviewed.   Recent Results (from the past 2160 hour(s))  Comp Met (CMET)     Status: Abnormal   Collection Time: 02/10/16  9:49 AM  Result Value Ref Range   Sodium 137 135 - 145 mEq/L   Potassium 3.8 3.5 - 5.1 mEq/L   Chloride 107 96 - 112 mEq/L   CO2 26 19 - 32 mEq/L   Glucose, Bld 102 (H) 70 - 99 mg/dL   BUN 21 6 - 23 mg/dL   Creatinine, Ser 0.83 0.40 - 1.20 mg/dL   Total Bilirubin 0.4 0.2 - 1.2 mg/dL   Alkaline Phosphatase 158 (H) 39 - 117 U/L   AST 15 0 - 37 U/L   ALT 13 0 - 35 U/L   Total Protein 7.7 6.0 - 8.3 g/dL   Albumin 4.1 3.5 - 5.2 g/dL   Calcium 9.2 8.4 - 10.5 mg/dL   GFR 74.98 >60.00 mL/min  TSH     Status: None   Collection Time: 02/10/16  9:49 AM  Result Value Ref Range   TSH 2.49 0.35 - 4.50 uIU/mL  Comp Met (CMET)     Status: Abnormal   Collection Time: 02/12/16  3:42 PM  Result Value Ref Range   Sodium 142 135 - 146 mmol/L   Potassium 5.1 3.5 - 5.3 mmol/L   Chloride 107 98 - 110 mmol/L   CO2 23 20 - 31 mmol/L   Glucose, Bld 89 65 - 99 mg/dL   BUN 12 7 - 25 mg/dL   Creat 0.82 0.50 - 1.05 mg/dL    Comment:   For patients > or = 58 years of age: The upper reference limit for Creatinine is approximately 13% higher for people identified as African-American.      Total Bilirubin 0.3 0.2 - 1.2 mg/dL   Alkaline Phosphatase 157 (H) 33 - 130 U/L   AST 15 10 - 35 U/L   ALT 13 6 - 29 U/L   Total Protein 7.3 6.1 - 8.1 g/dL   Albumin 4.1 3.6 - 5.1 g/dL   Calcium 9.0 8.6 - 10.4 mg/dL  Lipase     Status: None   Collection Time: 02/12/16  3:42 PM  Result Value Ref Range   Lipase 19 7 - 60 U/L  CBC with Differential/Platelet     Status: None    Collection  Time: 02/16/16  4:00 PM  Result Value Ref Range   WBC 8.0 4.0 - 10.5 K/uL   RBC 4.60 3.87 - 5.11 Mil/uL   Hemoglobin 13.1 12.0 - 15.0 g/dL   HCT 39.1 36.0 - 46.0 %   MCV 85.0 78.0 - 100.0 fl   MCHC 33.5 30.0 - 36.0 g/dL   RDW 13.6 11.5 - 15.5 %   Platelets 320.0 150.0 - 400.0 K/uL   Neutrophils Relative % 60.6 43.0 - 77.0 %   Lymphocytes Relative 27.9 12.0 - 46.0 %   Monocytes Relative 6.9 3.0 - 12.0 %   Eosinophils Relative 3.8 0.0 - 5.0 %   Basophils Relative 0.8 0.0 - 3.0 %   Neutro Abs 4.9 1.4 - 7.7 K/uL   Lymphs Abs 2.2 0.7 - 4.0 K/uL   Monocytes Absolute 0.6 0.1 - 1.0 K/uL   Eosinophils Absolute 0.3 0.0 - 0.7 K/uL   Basophils Absolute 0.1 0.0 - 0.1 K/uL  Sed Rate (ESR)     Status: None   Collection Time: 02/16/16  4:00 PM  Result Value Ref Range   Sed Rate 8 0 - 30 mm/hr  C-reactive protein     Status: Abnormal   Collection Time: 02/16/16  4:00 PM  Result Value Ref Range   CRP 0.1 (L) 0.5 - 20.0 mg/dL  Gamma GT     Status: None   Collection Time: 02/16/16  4:00 PM  Result Value Ref Range   GGT 27 7 - 51 U/L  VITAMIN D 25 Hydroxy (Vit-D Deficiency, Fractures)     Status: None   Collection Time: 02/18/16  3:22 PM  Result Value Ref Range   VITD 31.40 30.00 - 100.00 ng/mL    Assessment/Plan: Hemangioma of liver Noted on MRI 02/2016. Discussed typical benign nature of lesions. FU with GI as scheduled.  Thyroid nodule Will order US guided biopsy. Patient has been referred to Endocrinology.  Elevated alkaline phosphatase level Workup thus far unremarkable. Vitamin D level to be checked. Referral to Endocrinology placed for further assessment.     Leeanne Rio, PA-C

## 2016-03-02 ENCOUNTER — Encounter: Payer: Self-pay | Admitting: Neurology

## 2016-03-02 ENCOUNTER — Telehealth: Payer: Self-pay

## 2016-03-02 ENCOUNTER — Ambulatory Visit (INDEPENDENT_AMBULATORY_CARE_PROVIDER_SITE_OTHER): Payer: Medicare Other | Admitting: Neurology

## 2016-03-02 VITALS — BP 130/90 | HR 103 | Ht 63.0 in | Wt 160.3 lb

## 2016-03-02 DIAGNOSIS — F0781 Postconcussional syndrome: Secondary | ICD-10-CM | POA: Diagnosis not present

## 2016-03-02 DIAGNOSIS — D1802 Hemangioma of intracranial structures: Secondary | ICD-10-CM

## 2016-03-02 DIAGNOSIS — G43109 Migraine with aura, not intractable, without status migrainosus: Secondary | ICD-10-CM

## 2016-03-02 DIAGNOSIS — G43809 Other migraine, not intractable, without status migrainosus: Secondary | ICD-10-CM

## 2016-03-02 DIAGNOSIS — D1800 Hemangioma unspecified site: Secondary | ICD-10-CM

## 2016-03-02 MED ORDER — VENLAFAXINE HCL ER 75 MG PO CP24: 225.0000 mg | ORAL_CAPSULE | Freq: Every day | ORAL | Status: DC

## 2016-03-02 NOTE — Patient Instructions (Signed)
Migraine Recommendations: 1.  Change venlafaxine to venlafaxine XR 75mg .  Take 3 capsules every morning with breakfast.  Call in 4 weeks with update and we can adjust dose if needed. 2.  Take tramadol at earliest onset of headache.  May repeat dose in 6 hours if needed.  Limit use to no more than 2 days out of the week.  You must stop butalbital.  You can taper off of it gradually.  Take no more than 2 days out of the week for 1 week, then no more than 1 day a week for one week and then stop. 3.  Limit use of pain relievers to no more than 2 days out of the week.  These medications include acetaminophen, ibuprofen, triptans and narcotics.  This will help reduce risk of rebound headaches. 4.  Be aware of common food triggers such as processed sweets, processed foods with nitrites (such as deli meat, hot dogs, sausages), foods with MSG, alcohol (such as wine), chocolate, certain cheeses, certain fruits (dried fruits, some citrus fruit), vinegar, diet soda. 4.  Avoid caffeine 5.  Routine exercise 6.  Proper sleep hygiene 7.  Stay adequately hydrated with water 8.  Keep a headache diary. 9.  Maintain proper stress management. 10.  Do not skip meals. 11.  Consider supplements:  Magnesium oxide 400mg  to 600mg  daily, riboflavin 400mg , Coenzyme Q 10 100mg  three times daily  12. I will review your notes and determine what else needs to be done. 13.  Contact me in 4 to 6 weeks with update.  Follow up in 3 months.

## 2016-03-02 NOTE — Telephone Encounter (Signed)
-----   Message from Pieter Partridge, DO sent at 03/02/2016 12:31 PM EDT ----- After reviewing notes, I would like to contact patient and set up to repeat MRI of the brain with and without contrast in order to assess any changes and get a better image of the cavernoma in the pons.  (reason for MRI is cavernous angioma)
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
## 2016-03-02 NOTE — Telephone Encounter (Signed)
Pt scheduled at [facility] @ 1:45, arrive at 1:15 p.m.

## 2016-03-03 ENCOUNTER — Other Ambulatory Visit (HOSPITAL_COMMUNITY)
Admission: RE | Admit: 2016-03-03 | Discharge: 2016-03-03 | Disposition: A | Discharge status: Home / Self Care | Payer: Medicare Other | Source: Ambulatory Visit | Attending: Obstetrics & Gynecology | Admitting: Obstetrics & Gynecology

## 2016-03-03 ENCOUNTER — Encounter: Payer: Self-pay | Admitting: Obstetrics & Gynecology

## 2016-03-03 ENCOUNTER — Ambulatory Visit (INDEPENDENT_AMBULATORY_CARE_PROVIDER_SITE_OTHER): Payer: Medicare Other | Admitting: Obstetrics & Gynecology

## 2016-03-03 VITALS — BP 124/75 | HR 74 | Ht 63.0 in | Wt 161.0 lb

## 2016-03-03 DIAGNOSIS — Z1151 Encounter for screening for human papillomavirus (HPV): Secondary | ICD-10-CM | POA: Diagnosis not present

## 2016-03-03 DIAGNOSIS — C50911 Malignant neoplasm of unspecified site of right female breast: Secondary | ICD-10-CM | POA: Diagnosis not present

## 2016-03-03 DIAGNOSIS — Z01419 Encounter for gynecological examination (general) (routine) without abnormal findings: Secondary | ICD-10-CM | POA: Diagnosis not present

## 2016-03-03 DIAGNOSIS — Z124 Encounter for screening for malignant neoplasm of cervix: Secondary | ICD-10-CM | POA: Diagnosis not present

## 2016-03-03 NOTE — Progress Notes (Signed)
Patient ID: Whitney Fisher, female   DOB: 1956/05/25, 60 y.o.   MRN: [id number] Subjective:     Whitney Fisher is a 60 y.o. female here for a routine exam.  [id number]. LMP 2012. Current complaints: pt has been undergoing an eval of bloating and an elevated 'enzyme level'  She is going for a thyroid biopsy of a thyroid nodule coming up this coming Wed.  Pt s/p breast cancer in 2007.  Pt s/p lumpectomy and radiation and resection of sentinel node.  Pt was on Tamoxifen x 5 years. Stage 0.   Gynecologic History No LMP recorded. Patient is postmenopausal. Contraception: post menopausal status Last Pap: Pt unsure of date.  May be >5-8 years. Results were: normal Last mammogram: 2013. Results were: normal  Obstetric History OB History  No data available   Past Medical History  Diagnosis Date  . Ulcerative colitis ([place])   . Migraine   . S/P radiation therapy Age 70-48  . Hypertension   . Narcolepsy   . TIA (transient ischemic attack)   . Breast cancer ([place])     Right Breast  . Hyperlipidemia   . Low serum vitamin D   . B12 deficiency   . Thyroid disease   . Cavernous hemangioma     x2  . Visual floaters    Past Surgical History  Procedure Laterality Date  . Breast lumpectomy Right Age 61  . Carpal tunnel release Bilateral   . Carpal tunnel with cubital tunnel Right   . Neck surgery    . Hand surgery Right     Multiple Right & Left tfcc  . Anterior cruciate ligament repair Right   . Lumbar laminectomy  Age 61-30  . Bunionectomy Bilateral   . Hammer toe surgery Bilateral   . Achilles tendon lengthening Left   . Lipoma excision  Age 11  . Dilation and curettage of uterus      After 1st miscarriage  . Cesarean section  1989, 1991  . Biopsy thyroid      x2  . Abdominal hernia repair     Current Outpatient Prescriptions on File Prior to Visit  Medication Sig Dispense Refill  . amLODipine (NORVASC) 5 MG tablet Take 1 tablet (5 mg total) by mouth daily. Reported on 02/10/2016  30 tablet 1  . butalbital-acetaminophen-caffeine (FIORICET, ESGIC) 50-325-40 MG tablet Take 1 tablet by mouth as needed for headache. 20 tablet 0  . mesalamine (LIALDA) 1.2 g EC tablet Take 2 tablets (2.4 g total) by mouth daily with breakfast. 120 tablet 3  . mesalamine (ROWASA) 4 g enema Every night at bedtime (Patient taking differently: at bedtime as needed. ) 14 Bottle 3  . ondansetron (ZOFRAN) 4 MG tablet Take 1 tablet (4 mg total) by mouth every 6 (six) hours as needed for nausea or vomiting. 30 tablet 1  . rosuvastatin (CRESTOR) 10 MG tablet Take 1 tablet (10 mg total) by mouth daily. Reported on 02/10/2016 30 tablet 0  . topiramate (TOPAMAX) 100 MG tablet Take 1 tablet (100 mg total) by mouth 2 (two) times daily. 60 tablet 2  . traMADol (ULTRAM) 50 MG tablet Take 50 mg by mouth every 12 (twelve) hours as needed for moderate pain or severe pain. Last Phoned in & Filled on 02/12/16 #30 dispensed    . venlafaxine XR (EFFEXOR-XR) 75 MG 24 hr capsule Take 3 capsules (225 mg total) by mouth daily with breakfast. 90 capsule 0  . clopidogrel (PLAVIX) 75 MG tablet Take 75 mg  by mouth daily. Reported on 03/03/2016     No current facility-administered medications on file prior to visit.     The following portions of the patient's history were reviewed and updated as appropriate: allergies, current medications, past family history, past medical history, past social history, past surgical history and problem list.  Review of Systems Pertinent items are noted in HPI.    Objective:   BP 124/75 mmHg  Pulse 74  Ht 5\' 3"  (1.6 m)  Wt 161 lb (73.029 kg)  BMI 28.53 kg/m2 General Appearance:    Alert, cooperative, no distress, appears stated age  Head:    Normocephalic, without obvious abnormality, atraumatic  Eyes:    conjunctiva/corneas clear, EOM's intact, both eyes  Ears:    Normal external ear canals, both ears  Nose:   Nares normal, septum midline, mucosa normal, no drainage    or sinus  tenderness  Throat:   Lips, mucosa, and tongue normal; teeth and gums normal  Neck:   Supple, symmetrical, trachea midline, no adenopathy;    thyroid:  no enlargement/tenderness/nodules  Back:     Symmetric, no curvature, ROM normal, no CVA tenderness  Lungs:     Clear to auscultation bilaterally, respirations unlabored  Chest Wall:    No tenderness or deformity   Heart:    Regular rate and rhythm, S1 and S2 normal, no murmur, rub   or gallop  Breast Exam:    No tenderness, masses, or nipple abnormality. Well healed incision in right breast.  Abdomen:     Soft, non-tender, bowel sounds active all four quadrants,    no masses, no organomegaly; well healed high transverse incision.   Genitalia:    Normal female without lesion, discharge or tenderness     Extremities:   Extremities normal, atraumatic, no cyanosis or edema  Pulses:   2+ and symmetric all extremities  Skin:   Skin color, texture, turgor normal, no rashes or lesions     Assessment:    Healthy female exam.   H/o breast cancer on the right side. No imaging for >4-5 years    Plan:    Mammogram ordered. Follow up in: 1 year.    Diagnostic mammogram ordered due to h/o breast CA Request records from breast surgeon in [facility]. May need referral to breast surgery here pending records.(after review pts records have already been received by the Breast center).  Caldwell Kronenberger L. Harraway-Smith, M.D., Cherlynn June

## 2016-03-03 NOTE — Patient Instructions (Signed)

## 2016-03-04 LAB — CYTOLOGY - PAP

## 2016-03-05 ENCOUNTER — Ambulatory Visit (HOSPITAL_BASED_OUTPATIENT_CLINIC_OR_DEPARTMENT_OTHER): Payer: Medicare Other

## 2016-03-09 ENCOUNTER — Other Ambulatory Visit (HOSPITAL_COMMUNITY)
Admission: RE | Admit: 2016-03-09 | Discharge: 2016-03-09 | Disposition: A | Discharge status: Home / Self Care | Payer: Medicare Other | Source: Ambulatory Visit | Attending: Interventional Radiology | Admitting: Interventional Radiology

## 2016-03-09 ENCOUNTER — Ambulatory Visit
Admission: RE | Admit: 2016-03-09 | Discharge: 2016-03-09 | Disposition: A | Discharge status: Home / Self Care | Payer: Medicare Other | Source: Ambulatory Visit | Attending: Physician Assistant | Admitting: Physician Assistant

## 2016-03-09 DIAGNOSIS — E041 Nontoxic single thyroid nodule: Secondary | ICD-10-CM | POA: Diagnosis not present

## 2016-03-11 ENCOUNTER — Other Ambulatory Visit: Payer: Self-pay | Admitting: Physician Assistant

## 2016-03-11 NOTE — Telephone Encounter (Signed)
Rx request to pharmacy/[facility]  

## 2016-03-12 ENCOUNTER — Ambulatory Visit (HOSPITAL_BASED_OUTPATIENT_CLINIC_OR_DEPARTMENT_OTHER)
Admission: RE | Admit: 2016-03-12 | Discharge: 2016-03-12 | Disposition: A | Discharge status: Home / Self Care | Payer: Medicare Other | Source: Ambulatory Visit | Attending: Neurology | Admitting: Neurology

## 2016-03-12 DIAGNOSIS — D1802 Hemangioma of intracranial structures: Secondary | ICD-10-CM | POA: Diagnosis not present

## 2016-03-12 DIAGNOSIS — D1801 Hemangioma of skin and subcutaneous tissue: Secondary | ICD-10-CM | POA: Insufficient documentation

## 2016-03-12 DIAGNOSIS — D1800 Hemangioma unspecified site: Secondary | ICD-10-CM

## 2016-03-12 MED ORDER — GADOBENATE DIMEGLUMINE 529 MG/ML IV SOLN: 15.0000 mL | Freq: Once | INTRAVENOUS | Status: DC | PRN

## 2016-03-13 ENCOUNTER — Encounter: Payer: Self-pay | Admitting: Physician Assistant

## 2016-03-13 DIAGNOSIS — M159 Polyosteoarthritis, unspecified: Secondary | ICD-10-CM | POA: Insufficient documentation

## 2016-03-13 DIAGNOSIS — E010 Iodine-deficiency related diffuse (endemic) goiter: Secondary | ICD-10-CM | POA: Insufficient documentation

## 2016-03-13 DIAGNOSIS — Z853 Personal history of malignant neoplasm of breast: Secondary | ICD-10-CM | POA: Insufficient documentation

## 2016-03-13 DIAGNOSIS — K5190 Ulcerative colitis, unspecified, without complications: Secondary | ICD-10-CM | POA: Insufficient documentation

## 2016-03-13 DIAGNOSIS — M150 Primary generalized (osteo)arthritis: Secondary | ICD-10-CM

## 2016-03-13 DIAGNOSIS — Z1283 Encounter for screening for malignant neoplasm of skin: Secondary | ICD-10-CM | POA: Insufficient documentation

## 2016-03-13 DIAGNOSIS — H539 Unspecified visual disturbance: Secondary | ICD-10-CM | POA: Insufficient documentation

## 2016-03-13 DIAGNOSIS — G43709 Chronic migraine without aura, not intractable, without status migrainosus: Secondary | ICD-10-CM | POA: Insufficient documentation

## 2016-03-13 DIAGNOSIS — Z7689 Persons encountering health services in other specified circumstances: Secondary | ICD-10-CM | POA: Insufficient documentation

## 2016-03-13 NOTE — Assessment & Plan Note (Signed)
No palpable nodules on examination. Will check thyroid function and obtain US thyroid. Will then proceed with biopsy if indicated.

## 2016-03-13 NOTE — Assessment & Plan Note (Signed)
Endorses doing well at present. Referral to Gastroenterology placed for management and to set up schedule for repeat colonoscopies.

## 2016-03-13 NOTE — Assessment & Plan Note (Signed)
Referral to Gynecology for routine care placed.

## 2016-03-13 NOTE — Assessment & Plan Note (Signed)
Routine referral to Dermatology placed.

## 2016-03-13 NOTE — Assessment & Plan Note (Signed)
Referral to oncology placed. Will obtain imaging of breasts for surveillance. Orders placed.

## 2016-03-13 NOTE — Assessment & Plan Note (Signed)
Rx Tramadol for breakthrough pain. OTC medications reviewed. Supportive measures and water aerobics discussed. Referral to pain specialist placed.

## 2016-03-13 NOTE — Assessment & Plan Note (Signed)
Referral to Neurology placed. Medications refilled. She is to continue her current regimen as she recently restarted after finding medication in moving boxes.

## 2016-03-14 ENCOUNTER — Ambulatory Visit (INDEPENDENT_AMBULATORY_CARE_PROVIDER_SITE_OTHER): Payer: Medicare Other | Admitting: Internal Medicine

## 2016-03-14 ENCOUNTER — Encounter: Payer: Self-pay | Admitting: Physician Assistant

## 2016-03-14 ENCOUNTER — Encounter: Payer: Self-pay | Admitting: Internal Medicine

## 2016-03-14 ENCOUNTER — Telehealth: Payer: Self-pay

## 2016-03-14 VITALS — BP 98/78 | HR 76 | Temp 98.4°F | Wt 157.0 lb

## 2016-03-14 DIAGNOSIS — G47419 Narcolepsy without cataplexy: Secondary | ICD-10-CM | POA: Insufficient documentation

## 2016-03-14 DIAGNOSIS — Z1371 Encounter for nonprocreative screening for genetic disease carrier status: Secondary | ICD-10-CM | POA: Insufficient documentation

## 2016-03-14 DIAGNOSIS — D1800 Hemangioma unspecified site: Secondary | ICD-10-CM | POA: Insufficient documentation

## 2016-03-14 DIAGNOSIS — R635 Abnormal weight gain: Secondary | ICD-10-CM | POA: Diagnosis not present

## 2016-03-14 DIAGNOSIS — R748 Abnormal levels of other serum enzymes: Secondary | ICD-10-CM | POA: Diagnosis not present

## 2016-03-14 NOTE — Telephone Encounter (Signed)
-----   Message from Pieter Partridge, DO sent at 03/14/2016  7:01 AM EDT ----- Based on prior MRI reports, this MRI appears stable.  Nothing new.

## 2016-03-14 NOTE — Telephone Encounter (Signed)
Results released via mychart. 

## 2016-03-14 NOTE — Patient Instructions (Addendum)
Please stop at the lab.  I will send you the results through [facility].  Please come back for a follow-up appointment in 6 months.

## 2016-03-14 NOTE — Progress Notes (Signed)
Pre visit review using our clinic review tool, if applicable. No additional management support is needed unless otherwise documented below in the visit note. 

## 2016-03-14 NOTE — Progress Notes (Signed)
Patient ID: Whitney Fisher, female   DOB: [birth year], 60 y.o.   MRN: [id number]   HPI  Whitney Fisher is a 60 y.o.-year-old female, referred by her PCP, Whitney Rio, PA-C, for evaluation and management of elevated alkaline phosphatase w/o elevated GGT.  Pt was found to have a high alkaline phosphatase in 01/2016. She denies bone pain. No history of Paget disease or other bone-related diseases. No history of hypercalcemia/hyperparathyroidism.  I reviewed pt's pertinent labs: high Aphos with normal LFTs including GGT: Component     Latest Ref Rng & Units 06/16/2015 02/10/2016 02/12/2016 02/16/2016  Alkaline Phosphatase     33 - 130 U/L 171 (A) 158 (H) 157 (H)   AST     10 - 35 U/L 20 15 15    ALT     6 - 29 U/L 15 13 13    Total Protein     6.1 - 8.1 g/dL  7.7 7.3   Albumin     3.6 - 5.1 g/dL  4.1 4.1   GGT     7 - 51 U/L    27   Pt has a h/o vitamin D deficiency. She has been on vit D in the past, but now off. Previous vit D level - not on supplements: Lab Results  Component Value Date   VD25OH 31.40 02/18/2016   No h/o osteoporosis. She had a wrist fx and metacarpals in 2012.  She has a h/o ulcerative proctitis, with normal inflammatory markers: Component     Latest Ref Rng & Units 02/16/2016  Sed Rate     0 - 30 mm/hr 8  CRP     0.5 - 20.0 mg/dL 0.1 (L)   No h/o CKD. Last BUN/Cr: Lab Results  Component Value Date   BUN 12 02/12/2016   CREATININE 0.82 02/12/2016   No h/o thyrotoxicosis. Last TSH: Lab Results  Component Value Date   TSH 2.49 02/10/2016   Of note, pt was on Tamoxifen x 5 years  - finished course 5 years ago. She is on Cortifoam for ulcerative proctitis.  She also has a history of a benign thyroid nodule. This was biopsied on 03/11/2016.  ROS: Constitutional: + weight gain, + fatigue, + hot flushes, + poor sleep, + nocturia Eyes: + blurry vision, no xerophthalmia ENT: + sore throat, no nodules palpated in throat, no dysphagia/odynophagia, no  hoarseness Cardiovascular: no CP/SOB/palpitations/leg swelling Respiratory: + cough/no SOB Gastrointestinal: + N/no V/+ D/no C, no heartburn Musculoskeletal: + muscle aches/no joint aches Skin: no rashes, + itching, + easy bruising, no hair loss Neurological: no tremors/numbness/tingling/dizziness, + HA Psychiatric: + both: depression/anxiety  Past Medical History:  Diagnosis Date  . B12 deficiency   . Breast cancer ([place])    Right Breast  . Cavernous hemangioma    x2  . Hyperlipidemia   . Hypertension   . Low serum vitamin D   . Migraine   . Narcolepsy   . S/P radiation therapy Age [age]  . Thyroid disease   . TIA (transient ischemic attack)   . Ulcerative colitis ([place])   . Visual floaters    Past Surgical History:  Procedure Laterality Date  . ABDOMINAL HERNIA REPAIR    . ACHILLES TENDON LENGTHENING Left   . ANTERIOR CRUCIATE LIGAMENT REPAIR Right   . BIOPSY THYROID     x2  . BREAST LUMPECTOMY Right Age 67  . BUNIONECTOMY Bilateral   . CARPAL TUNNEL RELEASE Bilateral   . CARPAL TUNNEL WITH CUBITAL TUNNEL Right   .  [place]  . DILATION AND CURETTAGE OF UTERUS     After 1st miscarriage  . HAMMER TOE SURGERY Bilateral   . HAND SURGERY Right    Multiple Right & Left tfcc  . LIPOMA EXCISION  Age 65  . LUMBAR LAMINECTOMY  Age 75-30  . NECK SURGERY     Social History   Social History  . Marital status: Married    Spouse name: N/A  . Number of children: 2   Occupational History  . Disabled    Social History Main Topics  . Smoking status: Former Smoker    Types: Cigarettes    Quit date: 08/22/1978  . Smokeless tobacco: Never Used  . Alcohol use 0.0 oz/week     Comment: occ  . Drug use: No   Current Outpatient Prescriptions on File Prior to Visit  Medication Sig Dispense Refill  . amLODipine (NORVASC) 5 MG tablet Take 1 tablet (5 mg total) by mouth daily. Reported on 02/10/2016 30 tablet 1  . mesalamine (LIALDA) 1.2 g EC tablet Take 2  tablets (2.4 g total) by mouth daily with breakfast. 120 tablet 3  . mesalamine (ROWASA) 4 g enema Every night at bedtime (Patient taking differently: at bedtime as needed. ) 14 Bottle 3  . ondansetron (ZOFRAN) 4 MG tablet Take 1 tablet (4 mg total) by mouth every 6 (six) hours as needed for nausea or vomiting. 30 tablet 1  . rosuvastatin (CRESTOR) 10 MG tablet TAKE 1 TABLET BY MOUTH EVERY DAY 30 tablet 3  . topiramate (TOPAMAX) 100 MG tablet Take 1 tablet (100 mg total) by mouth 2 (two) times daily. 60 tablet 2  . traMADol (ULTRAM) 50 MG tablet Take 50 mg by mouth every 12 (twelve) hours as needed for moderate pain or severe pain. Last Phoned in & Filled on 02/12/16 #30 dispensed    . venlafaxine XR (EFFEXOR-XR) 75 MG 24 hr capsule Take 3 capsules (225 mg total) by mouth daily with breakfast. 90 capsule 0  . clopidogrel (PLAVIX) 75 MG tablet Take 75 mg by mouth daily. Reported on 03/03/2016     No current facility-administered medications on file prior to visit.    Allergies  Allergen Reactions  . Codeine Other (See Comments)    Dizziness  . Adhesive [Tape] Rash  . Sulfa Antibiotics Rash   Family History  Problem Relation Age of Onset  . Breast cancer Mother     Living  . Arthritis Mother   . Osteoporosis Mother   . Migraines Mother   . Glaucoma Maternal Grandmother   . Hypertension Father 81    Deceased  . Hyperlipidemia Father   . Parkinson's disease Father   . Parkinsonism Father   . Colon cancer Neg Hx   . Migraines Sister     PE: BP 98/78 (BP Location: Left Arm)   Pulse 76   Temp 98.4 F (36.9 C) (Oral)   Wt 157 lb (71.2 kg)   SpO2 99%   BMI 27.81 kg/m  Wt Readings from Last 3 Encounters:  03/14/16 157 lb (71.2 kg)  03/03/16 161 lb (73 kg)  03/02/16 160 lb 4.8 oz (72.7 kg)   Constitutional: overweight, in NAD. No kyphosis. Eyes: PERRLA, EOMI, no exophthalmos ENT: moist mucous membranes, no thyromegaly, no cervical lymphadenopathy Cardiovascular: RRR, No  MRG Respiratory: CTA B Gastrointestinal: abdomen soft, NT, ND, BS+ Musculoskeletal: no deformities, strength intact in all 4 Skin: moist, warm, no rashes Neurological: no tremor with outstretched hands,  DTR normal in all 4  Assessment: 1. High alkaline phosphatase  2. Weight gain  PLAN: 1.  High alkaline phosphatase - We discussed with the patient about the fact that alkaline phosphatase can be elevated as a sign of liver disease or increased bone turnover. As her GGT was normal, the most likely etiology is from bone. We discussed about the possible etiologies of isolated increased A phos:  Sample was obtained after a fatty meal (we'll try to obtain the next alkaline phosphatase level fasting)  Vitamin D deficiency >> osteomalacia (although her vitamin D level was normal recently, it has been low before)  Healing fractures (denies recent fractures)  bone metastases (will check a PTH-RP)  Osteogenic sarcoma   Paget disease (may need a bone scan)  Hyperparathyroidism (will check a PTH and calcium level)  Hyperthyroidism (recent TSH was normal)  - I will check: calcium level intact PTH ([facility]) PTHrp  We may also need to perform a bone scintigraphy.  -I will wait for the results of the above labs and will discuss with the plan with the patient.  - For now, I would not repeat her vitamin D, but I suggested that she starts 2000 units vitamin D over-the-counter daily; we would then need to recheck her vitamin D in 2 months. We'll need to check this fasting, so that we can add an A phos level - I will see the patient back in 6 months  2. Weight gain  - She inquired about ways to decrease her weight - We discussed about the fact that she would benefit from a plant--based diet   Component     Latest Ref Rng & Units 03/14/2016 03/14/2016         2:14 PM  2:14 PM  Calcium     8.7 - 10.2 mg/dL  9.2  PTH     15 - 65 pg/mL  47  PTH-Related Protein (PTH-RP)     14 - 27 pg/mL 13  (L)    No hyperparathyroidism. No signs of malignancy-related increased bone turnover.  Will repeat vit D in 2 mo and add a fasting Aphos for then.  Philemon Kingdom, MD PhD [facility]

## 2016-03-15 LAB — PTH, INTACT AND CALCIUM
Calcium: 9.2 mg/dL (ref 8.7–10.2)
PTH: 47 pg/mL (ref 15–65)

## 2016-03-17 ENCOUNTER — Ambulatory Visit
Admission: RE | Admit: 2016-03-17 | Discharge: 2016-03-17 | Disposition: A | Discharge status: Home / Self Care | Payer: Medicare Other | Source: Ambulatory Visit | Attending: Physician Assistant | Admitting: Physician Assistant

## 2016-03-17 DIAGNOSIS — Z853 Personal history of malignant neoplasm of breast: Secondary | ICD-10-CM

## 2016-03-17 DIAGNOSIS — N644 Mastodynia: Secondary | ICD-10-CM | POA: Diagnosis not present

## 2016-03-19 LAB — PTH-RELATED PEPTIDE: PTH-Related Protein (PTH-RP): 13 pg/mL — ABNORMAL LOW (ref 14–27)

## 2016-03-28 DIAGNOSIS — D1801 Hemangioma of skin and subcutaneous tissue: Secondary | ICD-10-CM | POA: Diagnosis not present

## 2016-03-28 DIAGNOSIS — L821 Other seborrheic keratosis: Secondary | ICD-10-CM | POA: Diagnosis not present

## 2016-03-28 DIAGNOSIS — L578 Other skin changes due to chronic exposure to nonionizing radiation: Secondary | ICD-10-CM | POA: Diagnosis not present

## 2016-03-30 DIAGNOSIS — M791 Myalgia: Secondary | ICD-10-CM | POA: Diagnosis not present

## 2016-03-30 DIAGNOSIS — M2550 Pain in unspecified joint: Secondary | ICD-10-CM | POA: Diagnosis not present

## 2016-03-30 DIAGNOSIS — G8929 Other chronic pain: Secondary | ICD-10-CM | POA: Diagnosis not present

## 2016-04-03 ENCOUNTER — Other Ambulatory Visit: Payer: Self-pay | Admitting: Physician Assistant

## 2016-04-04 NOTE — Telephone Encounter (Signed)
Rx request to pharmacy/[facility]  

## 2016-04-13 ENCOUNTER — Ambulatory Visit: Payer: Medicare Other | Admitting: Neurology

## 2016-04-20 ENCOUNTER — Other Ambulatory Visit: Payer: Self-pay | Admitting: Neurology

## 2016-04-20 NOTE — Telephone Encounter (Signed)
Rx sent 

## 2016-05-02 ENCOUNTER — Other Ambulatory Visit: Payer: Self-pay | Admitting: Physician Assistant

## 2016-05-04 ENCOUNTER — Encounter: Payer: Self-pay | Admitting: Physician Assistant

## 2016-05-04 MED ORDER — ATORVASTATIN CALCIUM 10 MG PO TABS: 10.0000 mg | ORAL_TABLET | Freq: Every day | ORAL | 3 refills | Status: DC

## 2016-05-17 ENCOUNTER — Other Ambulatory Visit: Payer: Self-pay | Admitting: Neurology

## 2016-05-17 NOTE — Telephone Encounter (Signed)
Rx sent to pharmacy   

## 2016-05-26 ENCOUNTER — Other Ambulatory Visit: Payer: Self-pay | Admitting: Physician Assistant

## 2016-06-07 ENCOUNTER — Ambulatory Visit: Payer: Medicare Other | Admitting: Neurology

## 2016-06-08 ENCOUNTER — Encounter: Payer: Self-pay | Admitting: Neurology

## 2016-06-08 ENCOUNTER — Ambulatory Visit (INDEPENDENT_AMBULATORY_CARE_PROVIDER_SITE_OTHER): Payer: Medicare Other | Admitting: Neurology

## 2016-06-08 VITALS — BP 128/84 | HR 75 | Ht 63.0 in | Wt 145.5 lb

## 2016-06-08 DIAGNOSIS — D1801 Hemangioma of skin and subcutaneous tissue: Secondary | ICD-10-CM

## 2016-06-08 DIAGNOSIS — G43109 Migraine with aura, not intractable, without status migrainosus: Secondary | ICD-10-CM

## 2016-06-08 DIAGNOSIS — D1800 Hemangioma unspecified site: Secondary | ICD-10-CM

## 2016-06-08 NOTE — Patient Instructions (Signed)
1.  Continue current regimen 2.  Follow up in 3 months.
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
## 2016-06-10 ENCOUNTER — Other Ambulatory Visit: Payer: Self-pay | Admitting: Neurology

## 2016-06-22 ENCOUNTER — Other Ambulatory Visit: Payer: Self-pay | Admitting: Physician Assistant

## 2016-06-23 NOTE — Telephone Encounter (Signed)
Patient needs appt for follow up  Thanks PC

## 2016-06-24 NOTE — Telephone Encounter (Signed)
Left message for patient to call and schedule appointment.

## 2016-07-01 ENCOUNTER — Telehealth: Payer: Self-pay | Admitting: Physician Assistant

## 2016-07-01 NOTE — Telephone Encounter (Signed)
Ok with me 

## 2016-07-01 NOTE — Telephone Encounter (Signed)
Patient is requesting to transfer care from [facility] to Dr. Lorelei Pont due to Cody's move to [place]. Please advise.

## 2016-07-04 ENCOUNTER — Other Ambulatory Visit: Payer: Self-pay | Admitting: Neurology

## 2016-07-17 ENCOUNTER — Other Ambulatory Visit: Payer: Self-pay | Admitting: Physician Assistant

## 2016-07-27 ENCOUNTER — Encounter: Payer: Self-pay | Admitting: Physician Assistant

## 2016-07-27 DIAGNOSIS — M533 Sacrococcygeal disorders, not elsewhere classified: Secondary | ICD-10-CM

## 2016-07-29 ENCOUNTER — Other Ambulatory Visit: Payer: Self-pay | Admitting: Neurology

## 2016-08-09 ENCOUNTER — Telehealth: Payer: Self-pay | Admitting: Emergency Medicine

## 2016-08-09 NOTE — Telephone Encounter (Signed)
Spoke with patient and advised her she would need an appointment for refill of the Tramadol. Patient states she will call back in 1 week or so. Her mother is in Hospice and she didn't want to miss any time with her.

## 2016-08-09 NOTE — Telephone Encounter (Signed)
Patient needs follow-up before further refill.

## 2016-08-09 NOTE — Telephone Encounter (Signed)
Indication for chronic opioid: Osteoarthritis Medication and dose: Tramadol 50 mg # pills per month: #30 Last UDS date: No Pain contract signed (Y/N): No Date narcotic database last reviewed (include red flags):  Please advise

## 2016-08-10 ENCOUNTER — Encounter: Payer: Self-pay | Admitting: Emergency Medicine

## 2016-08-10 MED ORDER — TRAMADOL HCL 50 MG PO TABS: 50.0000 mg | ORAL_TABLET | Freq: Two times a day (BID) | ORAL | 0 refills | Status: DC | PRN

## 2016-08-10 NOTE — Telephone Encounter (Signed)
I will give 20 tablets until her follow-up.

## 2016-08-10 NOTE — Addendum Note (Signed)
Addended by: Raiford Noble on: 08/10/2016 08:33 AM   Modules accepted: Orders

## 2016-08-23 ENCOUNTER — Other Ambulatory Visit: Payer: Self-pay | Admitting: Physician Assistant

## 2016-08-23 ENCOUNTER — Other Ambulatory Visit: Payer: Self-pay | Admitting: Neurology

## 2016-08-25 NOTE — Telephone Encounter (Signed)
Please advise of refill. Has pending appt with [facility] 09/08/16

## 2016-08-30 ENCOUNTER — Other Ambulatory Visit: Payer: Self-pay | Admitting: Physician Assistant

## 2016-08-31 NOTE — Telephone Encounter (Signed)
Last OV 06/08/16 Tramadol last filled 08/10/16 #20 with 0

## 2016-09-01 ENCOUNTER — Ambulatory Visit: Payer: Medicare Other | Admitting: Family Medicine

## 2016-09-06 ENCOUNTER — Other Ambulatory Visit: Payer: Self-pay | Admitting: Physician Assistant

## 2016-09-08 ENCOUNTER — Ambulatory Visit: Payer: Medicare Other | Admitting: Family Medicine

## 2016-09-15 ENCOUNTER — Encounter: Payer: Self-pay | Admitting: Internal Medicine

## 2016-09-15 ENCOUNTER — Ambulatory Visit (INDEPENDENT_AMBULATORY_CARE_PROVIDER_SITE_OTHER): Payer: Medicare Other | Admitting: Internal Medicine

## 2016-09-15 ENCOUNTER — Telehealth: Payer: Self-pay | Admitting: Physician Assistant

## 2016-09-15 ENCOUNTER — Encounter: Payer: Self-pay | Admitting: Family Medicine

## 2016-09-15 VITALS — BP 110/80 | HR 72 | Ht 63.0 in | Wt 142.0 lb

## 2016-09-15 DIAGNOSIS — R42 Dizziness and giddiness: Secondary | ICD-10-CM | POA: Diagnosis not present

## 2016-09-15 DIAGNOSIS — H8110 Benign paroxysmal vertigo, unspecified ear: Secondary | ICD-10-CM

## 2016-09-15 DIAGNOSIS — R748 Abnormal levels of other serum enzymes: Secondary | ICD-10-CM | POA: Diagnosis not present

## 2016-09-15 LAB — ALKALINE PHOSPHATASE: Alkaline Phosphatase: 129 U/L — ABNORMAL HIGH (ref 39–117)

## 2016-09-15 LAB — VITAMIN D 25 HYDROXY (VIT D DEFICIENCY, FRACTURES): VITD: 40.31 ng/mL (ref 30.00–100.00)

## 2016-09-15 NOTE — Telephone Encounter (Signed)
Patient called stating that her endocrinologist stated that the patient needed to have a maneuver done to help with her vertigo. That patient does not know the maneuver but would like to get scheduled ASAP as the vertigo is very limiting to her. Patient will be a new patient to Dr. Lorelei Pont, new patient appt scheduled for 10/13/16  Please advise  Phone: [phone]

## 2016-09-15 NOTE — Patient Instructions (Addendum)
Please stop at the lab.  We may need to start vitamin D after the labs are back.  Please return in 1 year.  Bone Scan A bone scan is an imaging study of your bones. It is used to identify and diagnose bone problems. You may have this test to check for:  Cancer in your bones.  A broken or cracked bone.  Bone infection.  A cause of bone pain.  Certain other bone diseases. For this test, a small amount of a radioactive substance (radiotracer) is injected into your blood. Your bones will absorb the radiotracer for a short time. The radiotracer gives off radioactive energy. This energy can be captured by a type of camera that makes images of your bones (scintigrams). Abnormal bones will take up too much or too little of the radiotracer. This will show up in the images. Tell a health care provider about:  Any allergies you have, including any previous reactions you have had during an exam that used radiotracer.  All medicines you are taking, including vitamins, herbs, eye drops, creams, and over-the-counter medicines.  Any blood disorders you have.  Any surgeries you have had.  Any medical conditions you have.  If you are pregnant or you think that you may be pregnant.  If you are breastfeeding. What are the risks? Generally, this is a safe procedure. However, problems may occur, including:  Exposure to radiation (a small amount).  Bleeding at the injection site.  Infection at the injection site. This is rare.  Allergicreaction to the radiotracer. Severe reactions may cause a rash or breathing trouble. These reactions are rare. What happens before the procedure?  Ask your health care provider about changing or stopping your regular medicines. This is especially important if you are taking diabetes medicines or blood thinners.  Do not take any medicines that contain bismuth for 4 days before the scan.  Do not have X-rays that use barium contrast material for 4 days before  the scan.  Do not wear metallic jewelry to the scan.  Do not drink very much for 4 hours before the scan. At the beginning of the scan, you will need to drink several glasses of water. What happens during the procedure?  An IV tube will be inserted into one of your veins.  You will lie down on an exam table.  The radiotracer will be injected through the IV tube. You may feel a cold sensation in your arm.  Some pictures (images) may be taken right after the injection.  Then you will need to drink 6-8 glasses of water to flush the excess tracer out of your system.  You may have to wait several hours before more images are taken.  You will get back on the exam table for more images.  The camera may move around your body.  You may be asked to stay still or to change your position. The procedure may vary among health care providers and hospitals. What happens after the procedure?  You may have to wait until a nuclear medicine specialist (radiologist) checks the images to make sure that they are readable.  More images may be taken, if necessary.  You will be watched to make sure that you do not have a reaction to the procedure or the injected medicine.  It is your responsibility to obtain your test results. Ask your health care provider or the department performing the test when and how you will get your results.  Return to your normal  activities as directed by your health care provider. This information is not intended to replace advice given to you by your health care provider. Make sure you discuss any questions you have with your health care provider. Document Released: 08/05/2000 Document Revised: 12/17/2015 Document Reviewed: 06/11/2014 Elsevier Interactive Patient Education  2017 [employer].

## 2016-09-15 NOTE — Telephone Encounter (Signed)
It sounds like she probably needs epley maneuvers, often done by PT.  Will refer her for this

## 2016-09-15 NOTE — Progress Notes (Signed)
Patient ID: Whitney Fisher, female   DOB: 24-Feb-1956, 61 y.o.   MRN: [id number]   HPI  Whitney Fisher is a 62 y.o.-year-old female, returning for f/u for elevated alkaline phosphatase w/o elevated GGT. Last visit 6 mo ago. PCP: Dr Silvestre Mesi.  She feels dizzy in last 4 days. She vomited once before this.   Pt was found to have a high alkaline phosphatase in 01/2016. No bone pain.  No history of Paget disease or other bone-related diseases.  No history of hypercalcemia/hyperparathyroidism.  I reviewed pt's pertinent labs: high Aphos with normal LFTs including GGT >> improving: Component     Latest Ref Rng & Units 06/16/2015 02/10/2016 02/12/2016 02/16/2016  Alkaline Phosphatase     33 - 130 U/L 171 (A) 158 (H) 157 (H)   AST     10 - 35 U/L 20 15 15    ALT     6 - 29 U/L 15 13 13    Total Protein     6.1 - 8.1 g/dL  7.7 7.3   Albumin     3.6 - 5.1 g/dL  4.1 4.1   GGT     7 - 51 U/L    27   At last visit, I advised her to come back for a fasting A phos >> did not return...  At last visit: Component     Latest Ref Rng & Units 03/14/2016 03/14/2016         2:14 PM  2:14 PM  Calcium     8.7 - 10.2 mg/dL  9.2  PTH     15 - 65 pg/mL  47  PTH-Related Protein (PTH-RP)     14 - 27 pg/mL 13 (L)    Pt has a h/o vitamin D deficiency. She has been on vit D in the past, not in last year.  Previous vit D level: Lab Results  Component Value Date   VD25OH 31.40 02/18/2016   No h/o osteoporosis. She had a wrist fx and metacarpals in 2012.  She has a h/o ulcerative proctitis, with normal inflammatory markers: Component     Latest Ref Rng & Units 02/16/2016  Sed Rate     0 - 30 mm/hr 8  CRP     0.5 - 20.0 mg/dL 0.1 (L)   No h/o CKD. Last BUN/Cr: Lab Results  Component Value Date   BUN 12 02/12/2016   CREATININE 0.82 02/12/2016   No h/o thyrotoxicosis. Last TSH: Lab Results  Component Value Date   TSH 2.49 02/10/2016   Of note, pt was on Tamoxifen x 5 years  - finished  course 5 years ago. She is on Cortifoam for ulcerative proctitis.  She also has a history of a benign thyroid nodule. This was biopsied on 03/11/2016 >> benign.  ROS: Constitutional: no weight gain/loss, + fatigue, no subjective hyperthermia/hypothermia Eyes: no blurry vision, no xerophthalmia ENT: no sore throat, no nodules palpated in throat, no dysphagia/odynophagia, no hoarseness Cardiovascular: no CP/SOB/palpitations/leg swelling Respiratory: no cough/SOB Gastrointestinal: no N/V/D/C Musculoskeletal: no muscle/joint aches Skin: no rashes Neurological: no tremors/numbness/tingling/+dizziness  I reviewed pt's medications, allergies, PMH, social hx, family hx, and changes were documented in the history of present illness. Otherwise, unchanged from my initial visit note.  Past Medical History:  Diagnosis Date  . B12 deficiency   . Breast cancer ([place])    Right Breast  . Cavernous hemangioma    x2  . Hyperlipidemia   . Hypertension   . Low serum vitamin D   .  Migraine   . Narcolepsy   . S/P radiation therapy Age [age]  . Thyroid disease   . TIA (transient ischemic attack)   . Ulcerative colitis ([place])   . Visual floaters    Past Surgical History:  Procedure Laterality Date  . ABDOMINAL HERNIA REPAIR    . ACHILLES TENDON LENGTHENING Left   . ANTERIOR CRUCIATE LIGAMENT REPAIR Right   . BIOPSY THYROID     x2  . BREAST LUMPECTOMY Right Age 34  . BUNIONECTOMY Bilateral   . CARPAL TUNNEL RELEASE Bilateral   . CARPAL TUNNEL WITH CUBITAL TUNNEL Right   . [place]  . DILATION AND CURETTAGE OF UTERUS     After 1st miscarriage  . HAMMER TOE SURGERY Bilateral   . HAND SURGERY Right    Multiple Right & Left tfcc  . LIPOMA EXCISION  Age 45  . LUMBAR LAMINECTOMY  Age [age]  . NECK SURGERY     Social History   Social History  . Marital status: Married    Spouse name: N/A  . Number of children: 2   Occupational History  . Disabled    Social History  Main Topics  . Smoking status: Former Smoker    Types: Cigarettes    Quit date: 08/22/1978  . Smokeless tobacco: Never Used  . Alcohol use 0.0 oz/week     Comment: occ  . Drug use: No   Current Outpatient Prescriptions on File Prior to Visit  Medication Sig Dispense Refill  . amLODipine (NORVASC) 5 MG tablet TAKE 1 TABLET BY MOUTH DAILY 30 tablet 1  . atorvastatin (LIPITOR) 10 MG tablet Take 1 tablet (10 mg total) by mouth daily. 30 tablet 3  . mesalamine (LIALDA) 1.2 g EC tablet Take 2 tablets (2.4 g total) by mouth daily with breakfast. 120 tablet 3  . mesalamine (ROWASA) 4 g enema Every night at bedtime (Patient taking differently: at bedtime as needed. ) 14 Bottle 3  . ondansetron (ZOFRAN) 4 MG tablet Take 1 tablet (4 mg total) by mouth every 6 (six) hours as needed for nausea or vomiting. 30 tablet 1  . topiramate (TOPAMAX) 100 MG tablet TAKE 1 TABLET BY MOUTH TWICE DAILY 60 tablet 0  . traMADol (ULTRAM) 50 MG tablet Take 1 tablet (50 mg total) by mouth every 12 (twelve) hours as needed for moderate pain or severe pain. Last Phoned in & Filled on 02/12/16 #30 dispensed 20 tablet 0  . venlafaxine XR (EFFEXOR-XR) 75 MG 24 hr capsule TAKE 3 CAPSULES(225 MG) BY MOUTH DAILY WITH BREAKFAST 270 capsule 0   No current facility-administered medications on file prior to visit.    Allergies  Allergen Reactions  . Codeine Other (See Comments)    Dizziness  . Adhesive [Tape] Rash  . Sulfa Antibiotics Rash   Family History  Problem Relation Age of Onset  . Breast cancer Mother     Living  . Arthritis Mother   . Osteoporosis Mother   . Migraines Mother   . Glaucoma Maternal Grandmother   . Hypertension Father 71    Deceased  . Hyperlipidemia Father   . Parkinson's disease Father   . Parkinsonism Father   . Colon cancer Neg Hx   . Migraines Sister     PE: BP 110/80 (BP Location: Left Arm, Patient Position: Sitting)   Pulse 72   Ht 5\' 3"  (1.6 m)   Wt 142 lb (64.4 kg)   BMI 25.15  kg/m  Wt  Readings from Last 3 Encounters:  09/15/16 142 lb (64.4 kg)  06/08/16 145 lb 8 oz (66 kg)  03/14/16 157 lb (71.2 kg)   Constitutional: overweight, in NAD. No kyphosis. Eyes: PERRLA, EOMI, no exophthalmos ENT: moist mucous membranes, no thyromegaly, no cervical lymphadenopathy Cardiovascular: RRR, No MRG Respiratory: CTA B Gastrointestinal: abdomen soft, NT, ND, BS+ Musculoskeletal: no deformities, strength intact in all 4 Skin: moist, warm, no rashes Neurological: no tremor with outstretched hands, DTR normal in all 4  Assessment: 1. High alkaline phosphatase  2. Dizziness  PLAN: 1.  High alkaline phosphatase - we again reviewed possible etiologies of isolated increased A phos:  after a fatty meal (will check alkaline phosphatase level today >>  Fasting except for small piece of bread)  Vitamin D deficiency >> osteomalacia (vitamin D level was normal at last check - will recheck, it has been low before). Not on supplements >> discussed that she may need to start.  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  - I will see the patient back in 1 year  2. Dizziness - started 4 days ago after vomiting - worse when turns in bed - likely BPPV >> suggested to see PCP or urgent care to perform the Dix-Hallpike maneuver  Component     Latest Ref Rng & Units 09/15/2016  Alkaline Phosphatase     39 - 117 U/L 129 (H)  VITD     30.00 - 100.00 ng/mL 40.31   Her alkaline phosphatase is only  slightly elevated now, but definitely improved. We'll continue to keep an eye on this but I do not feel that she would benefit from a bone scan. Vitamin D is normal.  Philemon Kingdom, MD PhD [facility]

## 2016-10-06 ENCOUNTER — Other Ambulatory Visit: Payer: Self-pay | Admitting: Emergency Medicine

## 2016-10-06 MED ORDER — TOPIRAMATE 100 MG PO TABS: 100.0000 mg | ORAL_TABLET | Freq: Two times a day (BID) | ORAL | 0 refills | Status: DC

## 2016-10-12 ENCOUNTER — Telehealth: Payer: Self-pay | Admitting: Behavioral Health

## 2016-10-12 ENCOUNTER — Encounter: Payer: Self-pay | Admitting: Behavioral Health

## 2016-10-12 NOTE — Telephone Encounter (Signed)
Pre-Visit Call completed with patient and chart updated.   Pre-Visit Info documented in Specialty Comments under [employer].    

## 2016-10-13 ENCOUNTER — Ambulatory Visit (INDEPENDENT_AMBULATORY_CARE_PROVIDER_SITE_OTHER): Payer: Medicare Other | Admitting: Family Medicine

## 2016-10-13 VITALS — BP 138/88 | HR 74 | Temp 97.9°F | Ht 63.0 in | Wt 145.0 lb

## 2016-10-13 DIAGNOSIS — G8929 Other chronic pain: Secondary | ICD-10-CM

## 2016-10-13 DIAGNOSIS — Z131 Encounter for screening for diabetes mellitus: Secondary | ICD-10-CM | POA: Diagnosis not present

## 2016-10-13 DIAGNOSIS — Z1329 Encounter for screening for other suspected endocrine disorder: Secondary | ICD-10-CM

## 2016-10-13 DIAGNOSIS — E782 Mixed hyperlipidemia: Secondary | ICD-10-CM

## 2016-10-13 DIAGNOSIS — M533 Sacrococcygeal disorders, not elsewhere classified: Secondary | ICD-10-CM

## 2016-10-13 DIAGNOSIS — E875 Hyperkalemia: Secondary | ICD-10-CM

## 2016-10-13 DIAGNOSIS — F4321 Adjustment disorder with depressed mood: Secondary | ICD-10-CM

## 2016-10-13 DIAGNOSIS — Z130 Encounter for screening for diseases of the blood and blood-forming organs and certain disorders involving the immune mechanism: Secondary | ICD-10-CM | POA: Diagnosis not present

## 2016-10-13 DIAGNOSIS — F4320 Adjustment disorder, unspecified: Secondary | ICD-10-CM

## 2016-10-13 MED ORDER — ATORVASTATIN CALCIUM 10 MG PO TABS: 10.0000 mg | ORAL_TABLET | Freq: Every day | ORAL | 3 refills | Status: DC

## 2016-10-13 NOTE — Progress Notes (Addendum)
[facility] [address] [place], [place] [address] [phone]  Date:  10/13/2016   Name:  Whitney Fisher   DOB:  20-Mar-1956   MRN:  [id number]  PCP:  Leeanne Rio, PA-C    Chief Complaint: Transfer of Care (Former pt of [facility]. Pt here to est care. Refill on Atorvastatin. Will need new referral sent to orthopedic surgery. Referral was sent by Whitney Fisher back in 08/15/23 but pt's mother passed away so pt was unable to be seen. )   History of Present Illness:  Whitney Fisher is a 61 y.o. very pleasant female patient who presents with the following:  Here today to transfer to this provider.  Prior patient of Whitney Aquas, PA-C who has moved to a new location  History of ulcerative proctitis, hypertension, hyperlipidemia, narcolepsy She was noted to have an elevated alk phos last year but this seems to have resolved. She was evaluated by endocrinology and reports that all seems to be ok in this regard  [facility] JulyBaker Fisher has been under some stress-Her mother died in early 2022-09-14 in [place]. She was very close with her and misses her a lot, in addition to all the extra travel and work involved in taking care of her estate.  However she feels like she is handling this ok and does not feel that she is depressed    She uses her GI meds only as needed- not using them right now Her GI doc is [facility] She is seeing Ihor Dow for he GYN care Dr. Tomi Likens for neurology - for her migraine headaches and cavernous angioma   She was supposed to see ortho - she had been getting SI injections in the past (when she lived in [place]) and needs to start back with this.   She would like to go to [facility] which Whitney Fisher had recommended to her  She last ate yesterday except a sip of juice today  BP Readings from Last 3 Encounters:  10/13/16 138/88  09/15/16 110/80  06/08/16 128/84    Patient Active Problem  List   Diagnosis Date Noted  . Cavernous hemangioma 03/14/2016  . Narcolepsy 03/14/2016  . BRCA negative 03/14/2016  . 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 Vision changes 03/13/2016  . Skin exam for malignant neoplasm 03/13/2016  . Referral of patient 03/13/2016  . Chronic migraine without aura without status migrainosus, not intractable 03/13/2016  . History of breast cancer 03/13/2016  . Primary osteoarthritis involving multiple joints 03/13/2016  . Elevated alkaline phosphatase level 02/28/2016  . Thyroid nodule 02/28/2016  . Hemangioma of liver 02/28/2016  . Ulcerative proctitis ([place]) 02/16/2016    Past Medical History:  Diagnosis Date  . B12 deficiency   . Breast cancer ([place])    Right Breast  . Cavernous hemangioma    x2  . Hyperlipidemia   . Hypertension   . Low serum vitamin D   . Migraine   . Narcolepsy   . S/P radiation therapy Age [age]  . Thyroid disease   . TIA (transient ischemic attack)   . Ulcerative colitis ([place])   . Visual floaters     Past Surgical History:  Procedure Laterality Date  . ABDOMINAL HERNIA REPAIR    . ACHILLES TENDON LENGTHENING Left   . ANTERIOR CRUCIATE LIGAMENT REPAIR Right   . BIOPSY THYROID     x2  . BREAST LUMPECTOMY Right Age  49  . BUNIONECTOMY Bilateral   . CARPAL TUNNEL RELEASE Bilateral   . CARPAL TUNNEL WITH CUBITAL TUNNEL Right   . [place]  . DILATION AND CURETTAGE OF UTERUS     After 1st miscarriage  . HAMMER TOE SURGERY Bilateral   . HAND SURGERY Right    Multiple Right & Left tfcc  . LIPOMA EXCISION  Age 65  . LUMBAR LAMINECTOMY  Age 55-30  . NECK SURGERY      Social History  Substance Use Topics  . Smoking status: Former Smoker    Types: Cigarettes    Quit date: 08/22/1978  . Smokeless tobacco: Never Used  . Alcohol use 0.0 oz/week     Comment: occ    Family History  Problem Relation Age of Onset  . Breast cancer Mother     Living  . Arthritis Mother   . Osteoporosis Mother   . Migraines  Mother   . Cancer Mother   . Glaucoma Maternal Grandmother   . Hypertension Father 51    Deceased  . Hyperlipidemia Father   . Parkinson's disease Father   . Parkinsonism Father   . Migraines Sister   . Colon cancer Neg Hx     Allergies  Allergen Reactions  . Codeine Other (See Comments)    Dizziness  . Adhesive [Tape] Rash  . Sulfa Antibiotics Rash    Medication list has been reviewed and updated.  Current Outpatient Prescriptions on File Prior to Visit  Medication Sig Dispense Refill  . amLODipine (NORVASC) 5 MG tablet TAKE 1 TABLET BY MOUTH DAILY 30 tablet 1  . mesalamine (LIALDA) 1.2 g EC tablet Take 2 tablets (2.4 g total) by mouth daily with breakfast. 120 tablet 3  . mesalamine (ROWASA) 4 g enema Every night at bedtime 14 Bottle 3  . ondansetron (ZOFRAN) 4 MG tablet Take 1 tablet (4 mg total) by mouth every 6 (six) hours as needed for nausea or vomiting. 30 tablet 1  . topiramate (TOPAMAX) 100 MG tablet Take 1 tablet (100 mg total) by mouth 2 (two) times daily. 60 tablet 0  . traMADol (ULTRAM) 50 MG tablet Take 1 tablet (50 mg total) by mouth every 12 (twelve) hours as needed for moderate pain or severe pain. Last Phoned in & Filled on 02/12/16 #30 dispensed 20 tablet 0  . venlafaxine XR (EFFEXOR-XR) 75 MG 24 hr capsule TAKE 3 CAPSULES(225 MG) BY MOUTH DAILY WITH BREAKFAST 270 capsule 0   No current facility-administered medications on file prior to visit.     Review of Systems:  As per HPI- otherwise negative.   Physical Examination: Vitals:   10/13/16 1434  BP: 138/88  Pulse: 74  Temp: 97.9 F (36.6 C)   Vitals:   10/13/16 1434  Weight: 145 lb (65.8 kg)  Height: 5' 3"  (1.6 m)   Body mass index is 25.69 kg/m. Ideal Body Weight: Weight in (lb) to have BMI = 25: 140.8  GEN: WDWN, NAD, Non-toxic, A & O x 3, looks well HEENT: Atraumatic, Normocephalic. Neck supple. No masses, No LAD. Ears and Nose: No external deformity. CV: RRR, No M/G/R. No JVD. No  thrill. No extra heart sounds. PULM: CTA B, no wheezes, crackles, rhonchi. No retractions. No resp. distress. No accessory muscle use. EXTR: No c/c/e NEURO Normal gait.  PSYCH: Normally interactive. Conversant. Not depressed or anxious appearing.  Calm demeanor.    Assessment and Plan: Mixed hyperlipidemia - Plan: atorvastatin (LIPITOR) 10 MG tablet,  Lipid panel  Screening for diabetes mellitus - Plan: Comprehensive metabolic panel, Hemoglobin A1c  Screening for thyroid disorder - Plan: TSH  Screening for deficiency anemia - Plan: CBC  Chronic left SI joint pain - Plan: Ambulatory referral to Orthopedic Surgery  [place]  Here today to establish care with me and to catch up on a few concerns Refilled her lipid medication, will obtain labs as above Referral to ortho for her knee and back concerns Will plan further follow- up pending labs. At this time she feels like she is handling her mom's death ok but will let me know if she feels that she is becoming depressed   Signed Lamar Blinks, MD  Results for orders placed or performed in visit on 10/13/16  CBC  Result Value Ref Range   WBC 8.0 4.0 - 10.5 K/uL   RBC 4.56 3.87 - 5.11 Mil/uL   Platelets 308.0 150.0 - 400.0 K/uL   Hemoglobin 13.4 12.0 - 15.0 g/dL   HCT 40.7 36.0 - 46.0 %   MCV 89.4 78.0 - 100.0 fl   MCHC 33.0 30.0 - 36.0 g/dL   RDW 13.2 11.5 - 15.5 %  Comprehensive metabolic panel  Result Value Ref Range   Sodium 146 (H) 135 - 145 mEq/L   Potassium 5.2 (H) 3.5 - 5.1 mEq/L   Chloride 108 96 - 112 mEq/L   CO2 29 19 - 32 mEq/L   Glucose, Bld 81 70 - 99 mg/dL   BUN 13 6 - 23 mg/dL   Creatinine, Ser 0.98 0.40 - 1.20 mg/dL   Total Bilirubin 0.4 0.2 - 1.2 mg/dL   Alkaline Phosphatase 132 (H) 39 - 117 U/L   AST 15 0 - 37 U/L   ALT 17 0 - 35 U/L   Total Protein 7.8 6.0 - 8.3 g/dL   Albumin 4.3 3.5 - 5.2 g/dL   Calcium 9.9 8.4 - 10.5 mg/dL   GFR 61.76 >60.00 mL/min  TSH  Result Value Ref Range   TSH 3.07 0.35  - 4.50 uIU/mL  Lipid panel  Result Value Ref Range   Cholesterol 186 0 - 200 mg/dL   Triglycerides 99.0 0.0 - 149.0 mg/dL   HDL 96.00 >39.00 mg/dL   VLDL 19.8 0.0 - 40.0 mg/dL   LDL Cholesterol 71 0 - 99 mg/dL   Total CHOL/HDL Ratio 2    NonHDL 90.49   Hemoglobin A1c  Result Value Ref Range   Hgb A1c MFr Bld 6.0 4.6 - 6.5 %   Message to pt regarding her labs

## 2016-10-13 NOTE — Patient Instructions (Signed)
It was very nice to meet you today- take care and I will be in touch with your labs asap We also did a referral to [facility] for you today We can plan our next visit based on your lab results- probably 6 months Your BP looks fine today  It is certainly normal to feel sad and to mourn following the death of one's mother.  However if you ever feel like you are not doing ok or if your mourning changes into depression please let me know

## 2016-10-14 LAB — TSH: TSH: 3.07 u[IU]/mL (ref 0.35–4.50)

## 2016-10-14 LAB — COMPREHENSIVE METABOLIC PANEL
ALT: 17 U/L (ref 0–35)
AST: 15 U/L (ref 0–37)
Albumin: 4.3 g/dL (ref 3.5–5.2)
Alkaline Phosphatase: 132 U/L — ABNORMAL HIGH (ref 39–117)
BILIRUBIN TOTAL: 0.4 mg/dL (ref 0.2–1.2)
BUN: 13 mg/dL (ref 6–23)
CHLORIDE: 108 meq/L (ref 96–112)
CO2: 29 meq/L (ref 19–32)
CREATININE: 0.98 mg/dL (ref 0.40–1.20)
Calcium: 9.9 mg/dL (ref 8.4–10.5)
GFR: 61.76 mL/min (ref >60.00)
GLUCOSE: 81 mg/dL (ref 70–99)
Potassium: 5.2 mEq/L — ABNORMAL HIGH (ref 3.5–5.1)
Sodium: 146 mEq/L — ABNORMAL HIGH (ref 135–145)
Total Protein: 7.8 g/dL (ref 6.0–8.3)

## 2016-10-14 LAB — CBC
HCT: 40.7 % (ref 36.0–46.0)
Hemoglobin: 13.4 g/dL (ref 12.0–15.0)
MCHC: 33 g/dL (ref 30.0–36.0)
MCV: 89.4 fl (ref 78.0–100.0)
Platelets: 308 10*3/uL (ref 150.0–400.0)
RBC: 4.56 Mil/uL (ref 3.87–5.11)
RDW: 13.2 % (ref 11.5–15.5)
WBC: 8 10*3/uL (ref 4.0–10.5)

## 2016-10-14 LAB — HEMOGLOBIN A1C: HEMOGLOBIN A1C: 6 % (ref 4.6–6.5)

## 2016-10-14 LAB — LIPID PANEL
CHOLESTEROL: 186 mg/dL (ref 0–200)
HDL: 96 mg/dL (ref >39.00)
LDL Cholesterol: 71 mg/dL (ref 0–99)
NonHDL: 90.49
TRIGLYCERIDES: 99 mg/dL (ref 0.0–149.0)
Total CHOL/HDL Ratio: 2
VLDL: 19.8 mg/dL (ref 0.0–40.0)

## 2016-10-16 NOTE — Addendum Note (Signed)
Addended by: Lamar Blinks C on: 10/16/2016 02:28 PM   Modules accepted: Orders

## 2016-10-30 ENCOUNTER — Other Ambulatory Visit: Payer: Self-pay | Admitting: Physician Assistant

## 2016-11-10 DIAGNOSIS — H903 Sensorineural hearing loss, bilateral: Secondary | ICD-10-CM | POA: Diagnosis not present

## 2016-11-10 DIAGNOSIS — H6122 Impacted cerumen, left ear: Secondary | ICD-10-CM | POA: Diagnosis not present

## 2016-11-10 DIAGNOSIS — R42 Dizziness and giddiness: Secondary | ICD-10-CM | POA: Diagnosis not present

## 2016-11-28 ENCOUNTER — Other Ambulatory Visit: Payer: Self-pay | Admitting: Neurology

## 2017-01-03 IMAGING — US US ABDOMEN LIMITED
1 series · 14 of 25 positions shown · non-contrast
Comparison: None.

CLINICAL DATA: Epigastric pain and bloating for 1 week

EXAM:
US ABDOMEN LIMITED - RIGHT UPPER QUADRANT

[Series 1: us abdomen limited · 0.17mm/px · 14 of 54 slices shown]
[im 1/54]
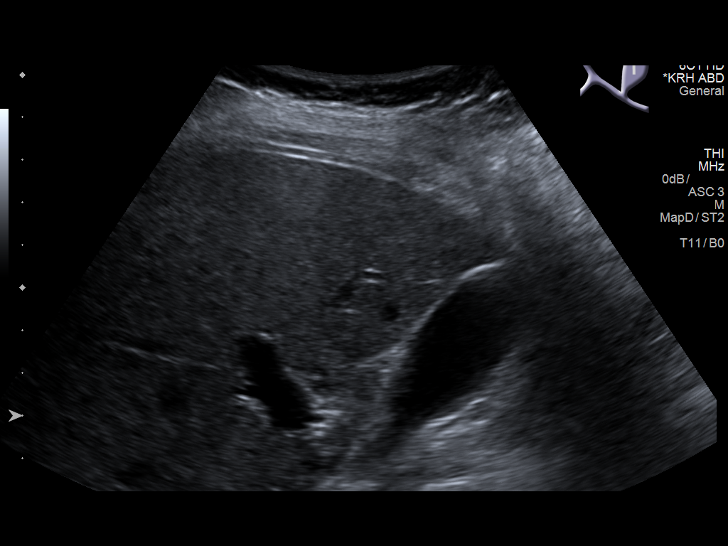
[im 5/54]
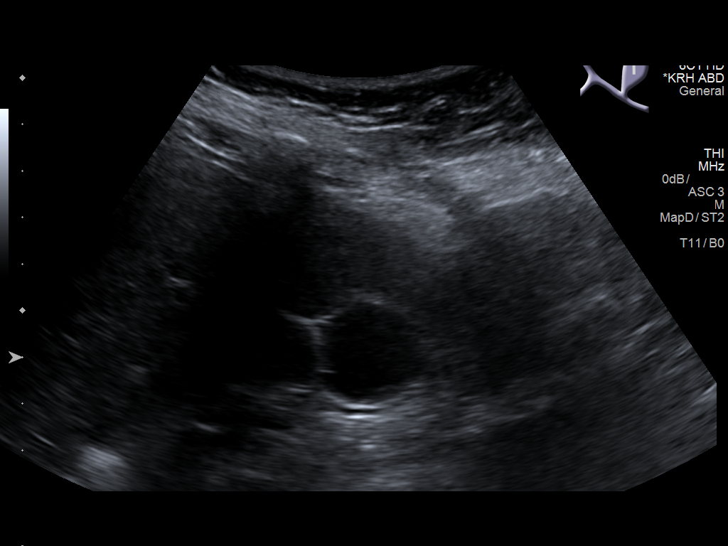
[im 9/54]
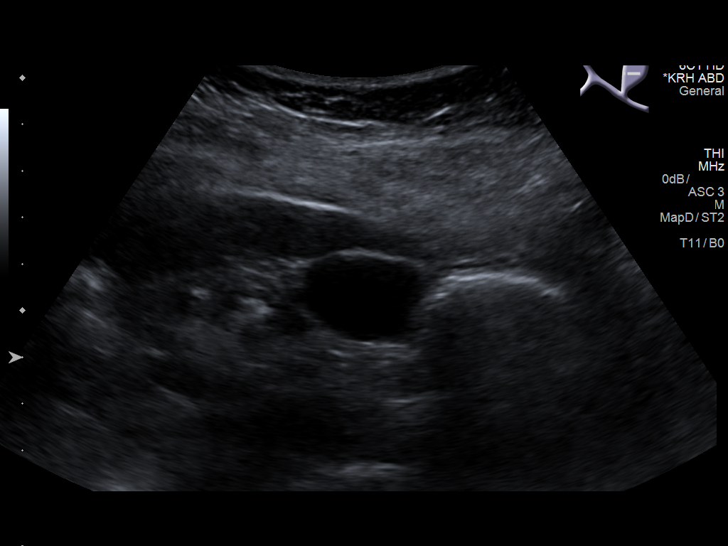
[im 14/54]
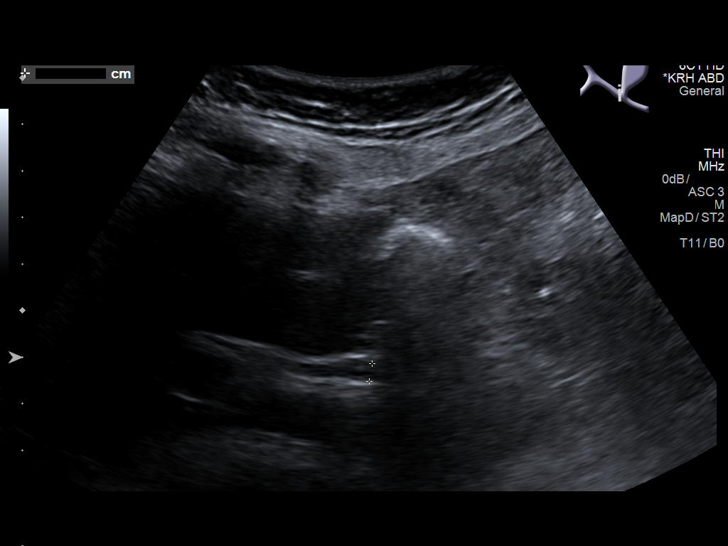
[im 18/54]
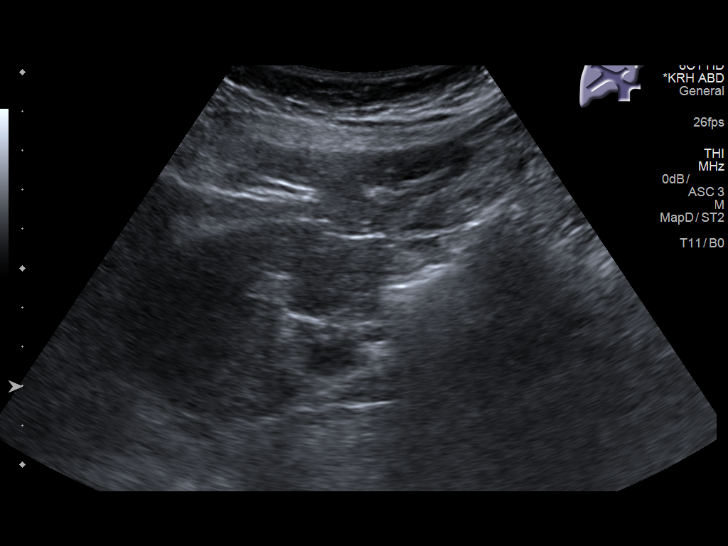
[im 20/54]
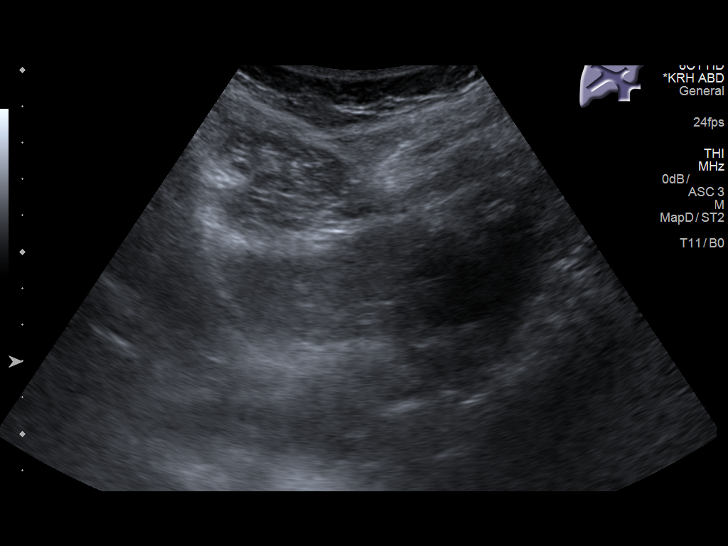
[im 25/54]
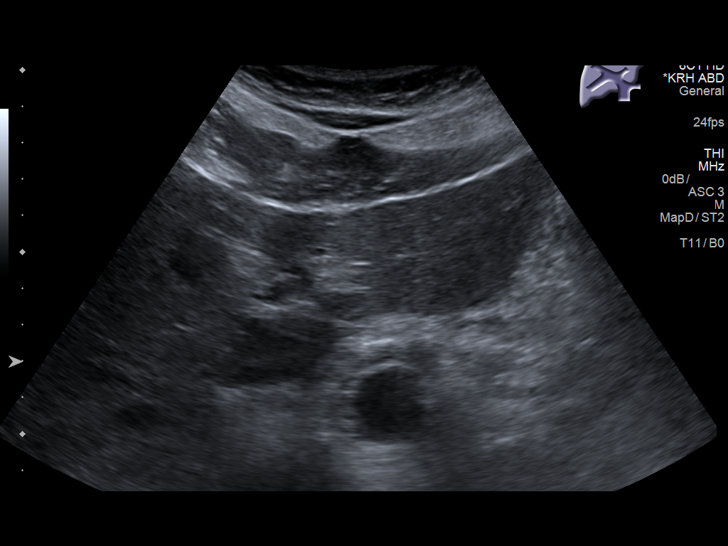
[im 29/54]
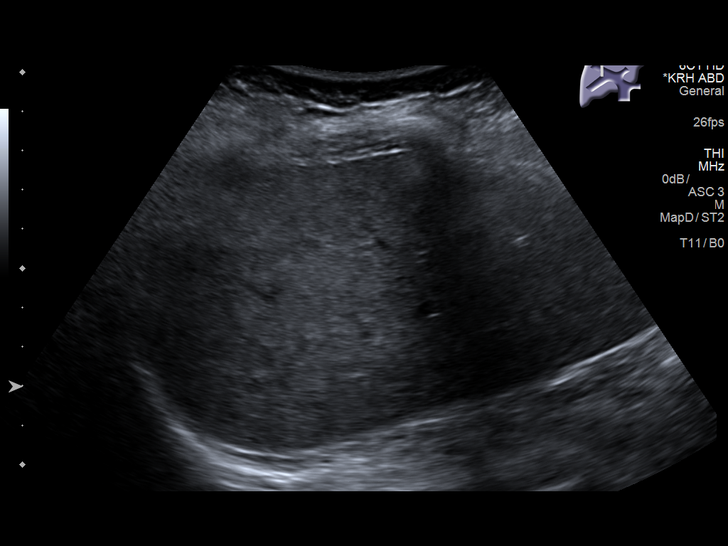
[im 34/54]
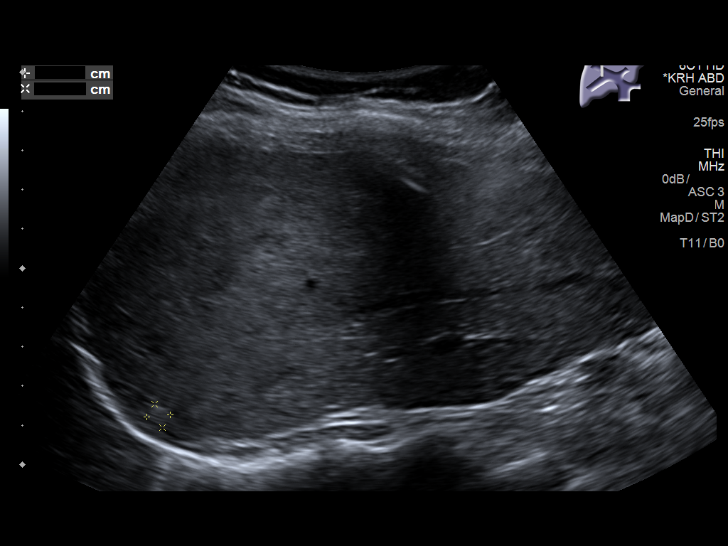
[im 36/54]
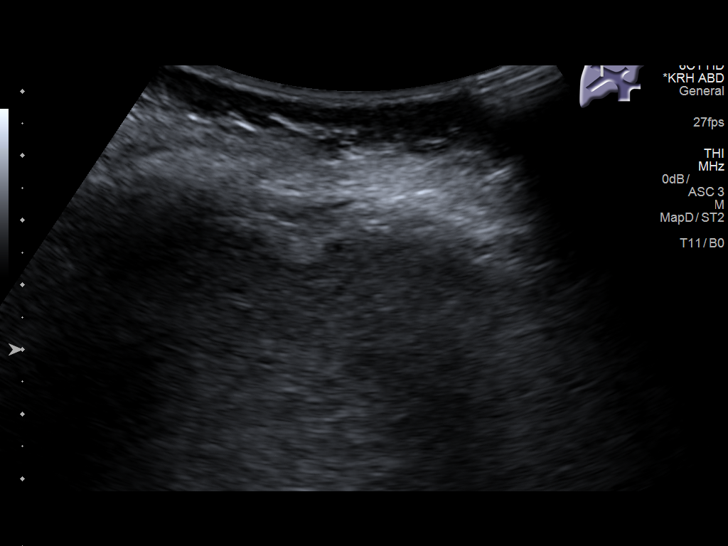
[im 40/54]
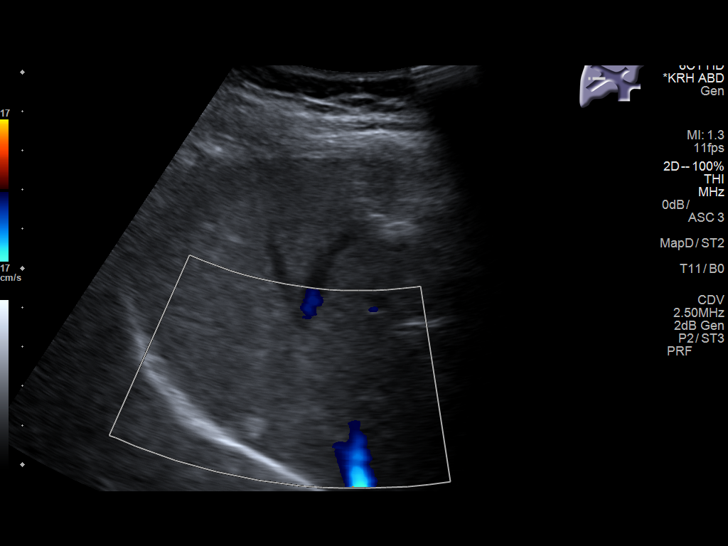
[im 45/54]
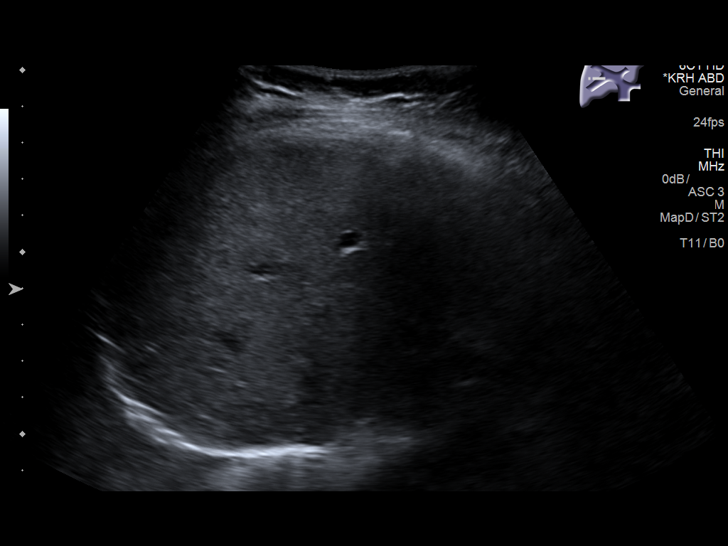
[im 49/54]
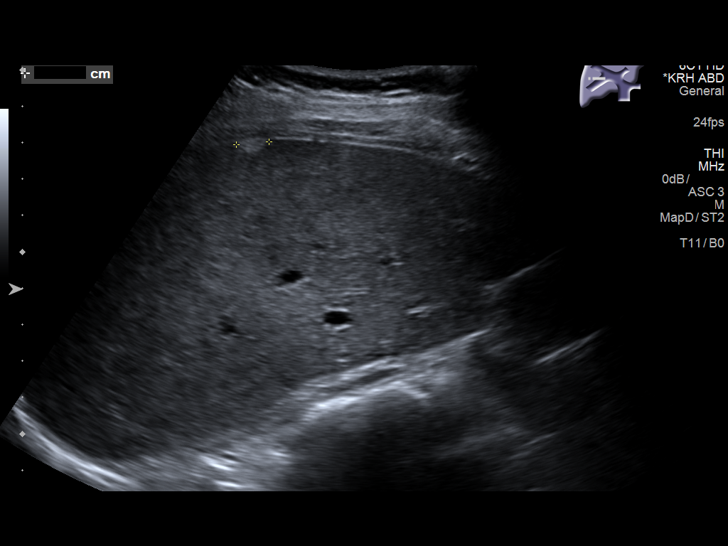
[im 54/54]
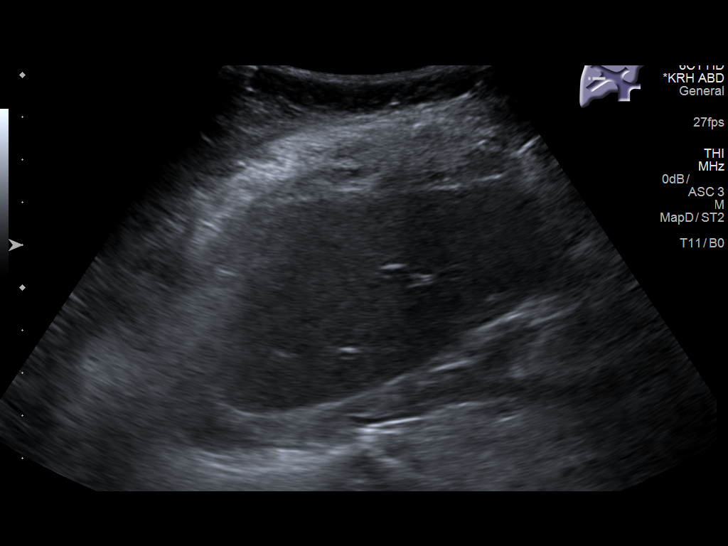

[14 of 25 positions shown; findings below may reference images not displayed]

FINDINGS: Gallbladder:

No gallstones or wall thickening visualized. No sonographic Murphy
sign noted by sonographer.

Common bile duct:

Diameter: 3.9 mm in diameter within normal limits.

Liver:

There is hyperechoic lesion in left hepatic lobe measures 1.1 x
cm. Hyperechoic lesion in right lobe laterally measures 7.5 by
mm. There is a hyperechoic lesion in anterior aspect of right
hepatic lobe measures 9 x 9 mm. Statistically this most likely
represent hemangiomas. Confirmation with enhanced CT scan or MRI
could be performed.
IMPRESSION: 1. No gallstones are noted within gallbladder. Normal CBD. There is
hyperechoic lesion in left hepatic lobe measures 1.1 x 1.1 cm.
Hyperechoic lesion in right lobe laterally measures 7.5 by 6.3 mm.
There is a hyperechoic lesion in anterior aspect of right hepatic
lobe measures 9 x 9 mm. Statistically this most likely represent
hemangiomas. Confirmation with enhanced CT scan or MRI could be
performed.

## 2017-01-10 IMAGING — US US SOFT TISSUE HEAD/NECK
1 series · 14 of 25 positions shown · non-contrast
Comparison: None available

CLINICAL DATA: Thyromegaly, previous left biopsy not otherwise
specified

EXAM:
THYROID ULTRASOUND
TECHNIQUE: Ultrasound examination of the thyroid gland and adjacent soft
tissues was performed.

[Series 1: us soft tissue head/neck · 0.04mm/px · 14 of 26 slices shown]
[im 1/26]
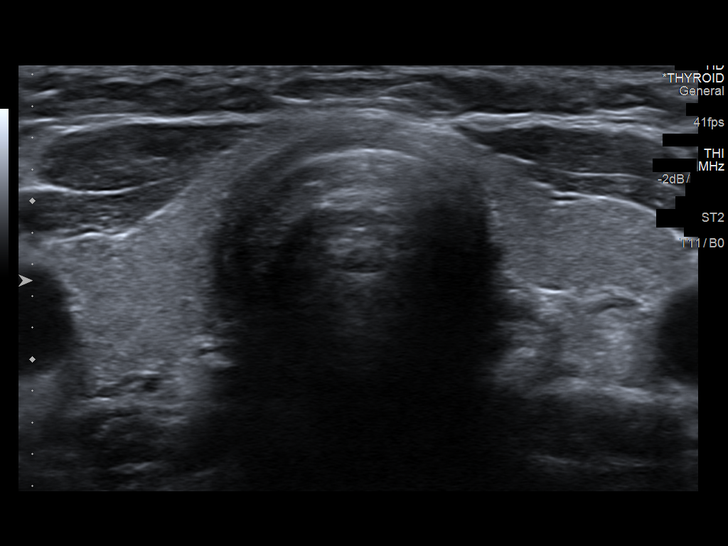
[im 3/26]
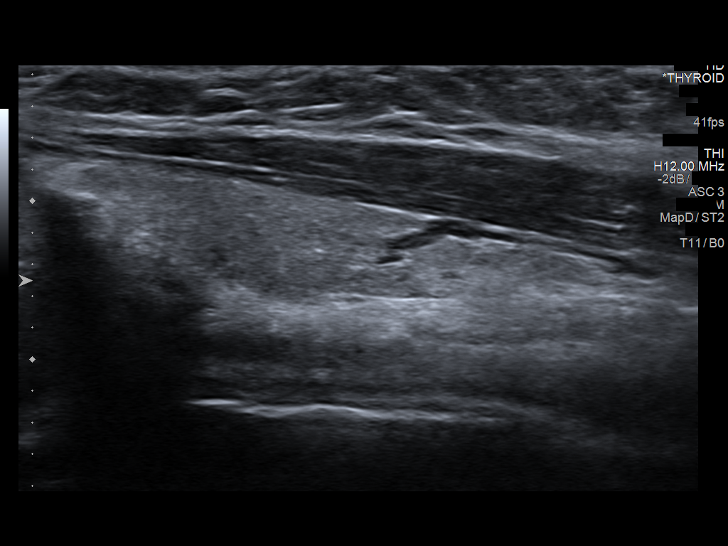
[im 5/26]
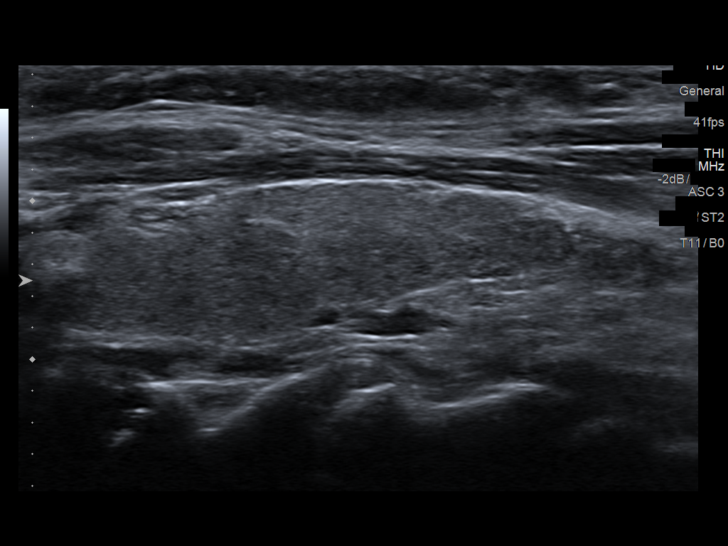
[im 7/26]
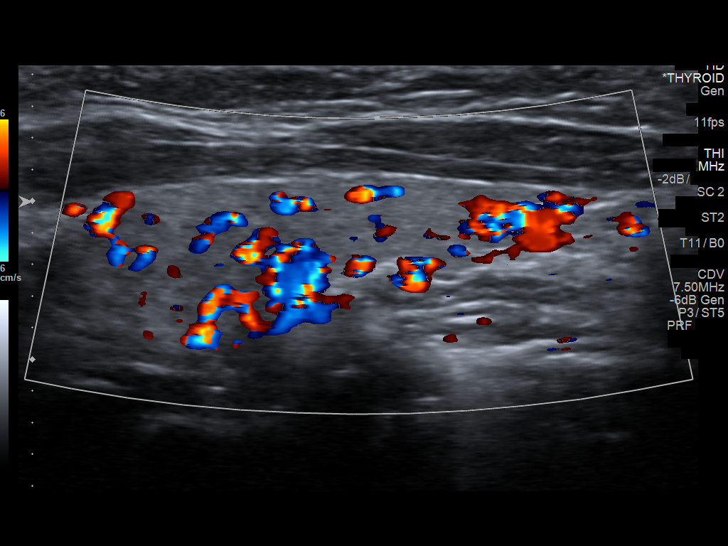
[im 9/26]
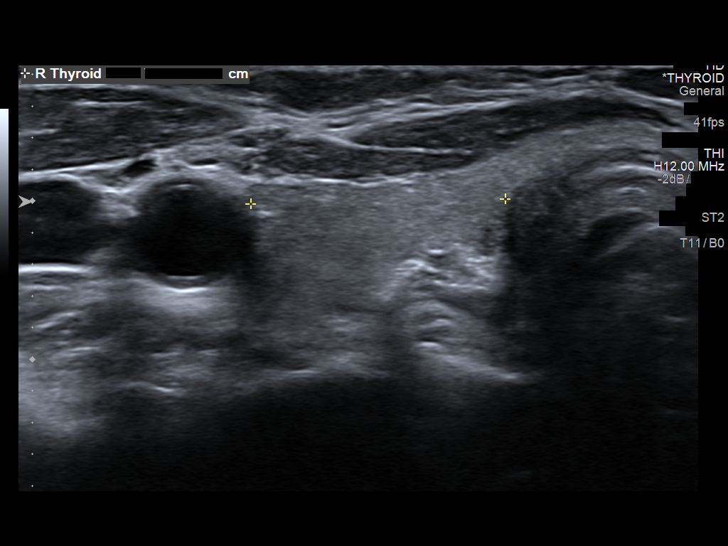
[im 10/26]
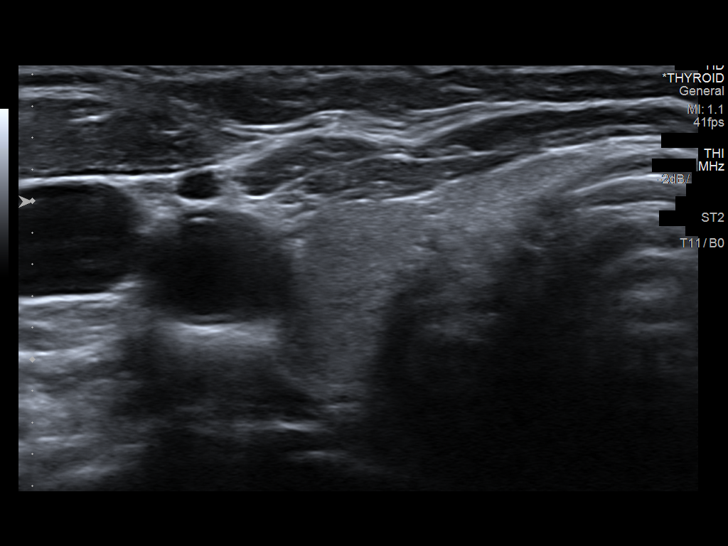
[im 12/26]
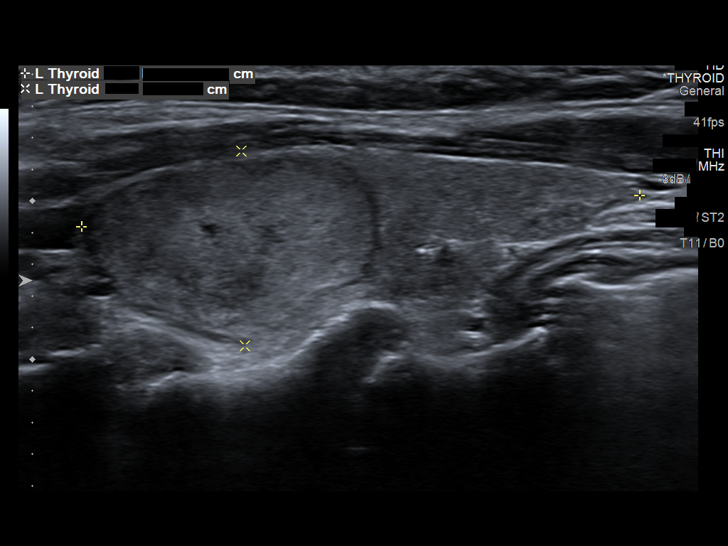
[im 14/26]
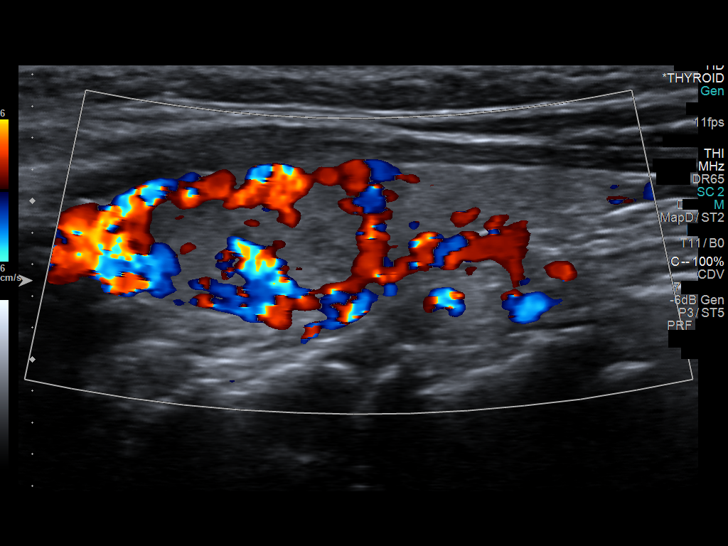
[im 16/26]
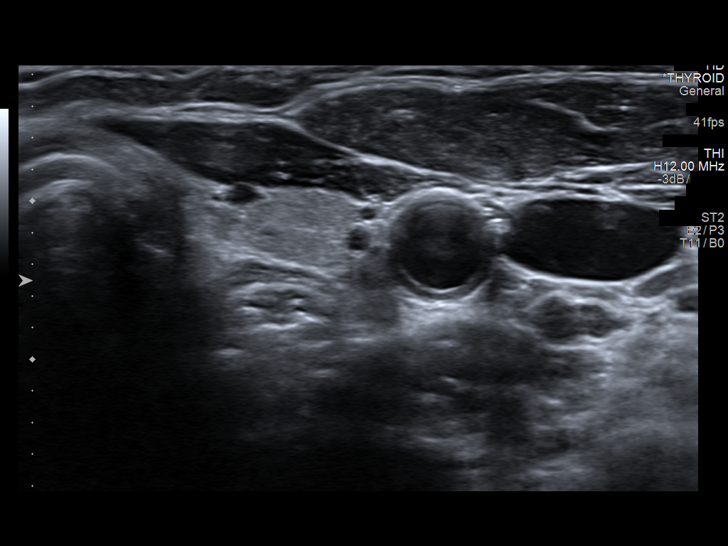
[im 17/26]
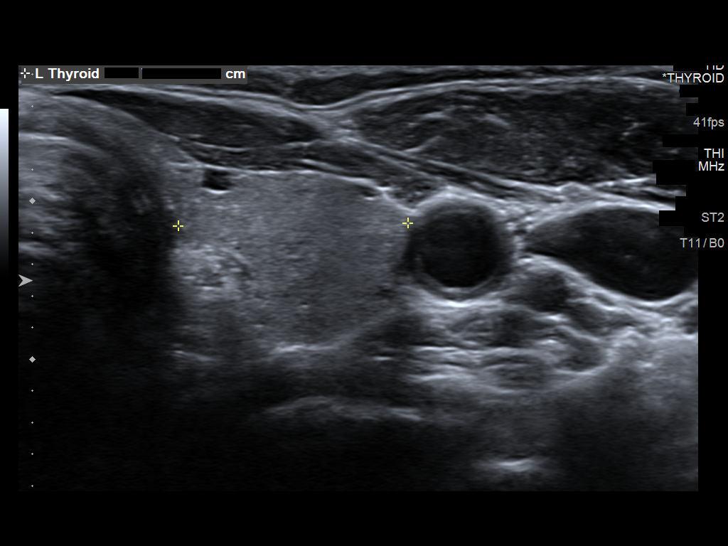
[im 19/26]
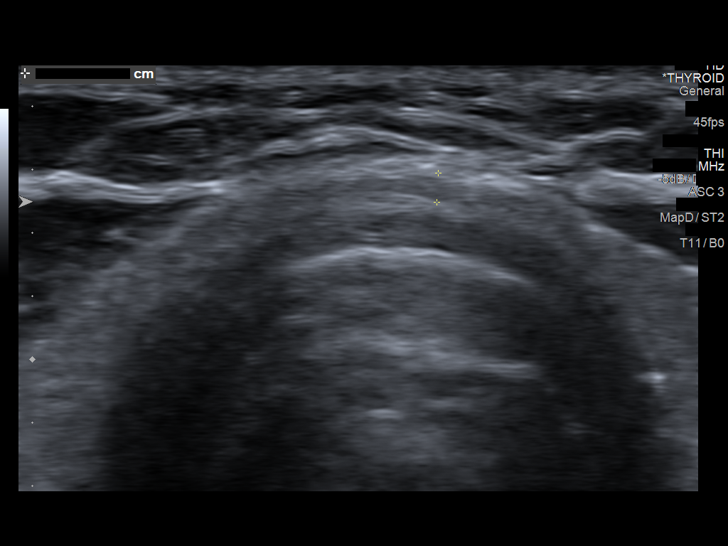
[im 21/26]
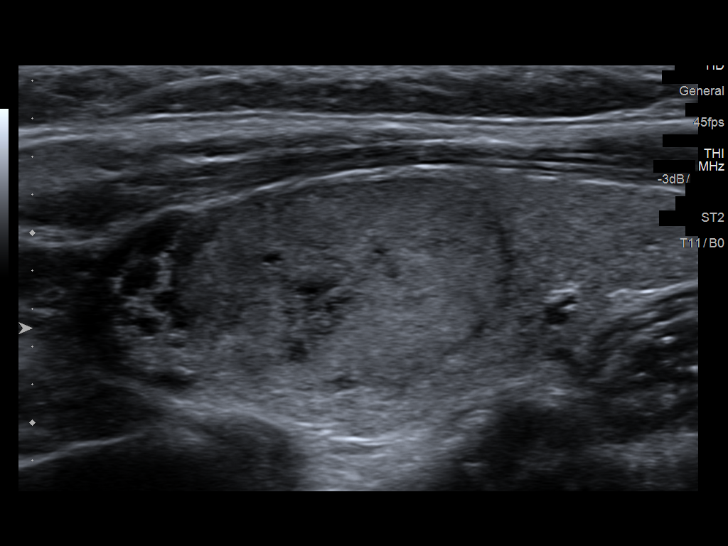
[im 23/26]
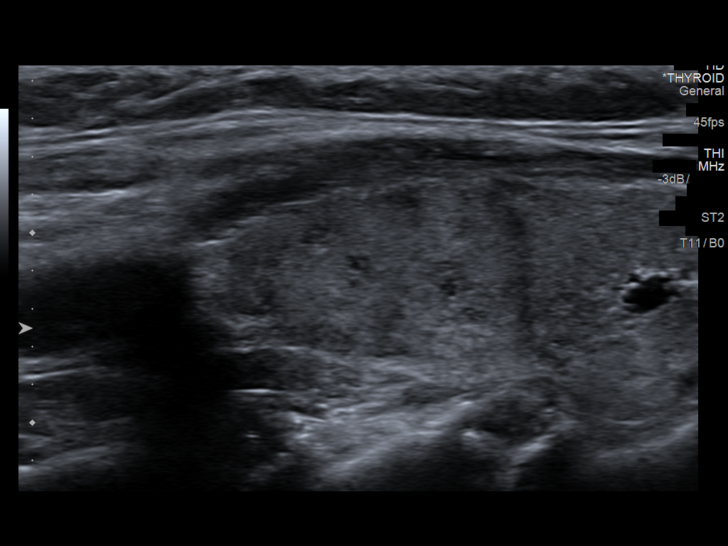
[im 26/26]
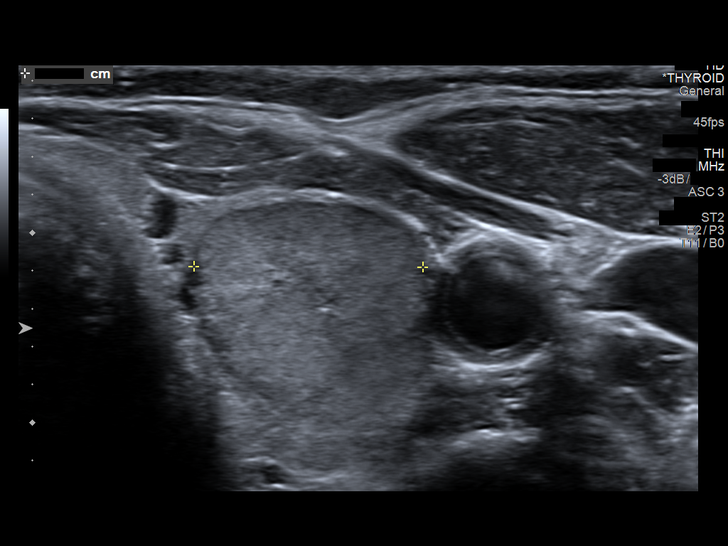

[14 of 25 positions shown; findings below may reference images not displayed]

FINDINGS: Right thyroid lobe

Measurements: 4.3 x 1 x 1.6 cm. Fairly homogeneous background
echotexture with mild hyperemia. No focal lesion.

Left thyroid lobe

Measurements: 3.5 x 1.2 x 1.5 cm. Solitary 1.8 x 1.2 x 1.1 cm solid
nodule without microcalcifications, superior pole.

Isthmus

Thickness: 0.1 cm.  No nodules visualized.

Lymphadenopathy

None visualized.
IMPRESSION: 1. Normal-sized thyroid with solitary 1.8 cm left nodule. Findings
meet consensus criteria for biopsy, if not already performed, as per
the consensus statement: Management of Thyroid Nodules Detected at
US: Society of Radiologists in Ultrasound Consensus Conference

## 2017-02-20 ENCOUNTER — Other Ambulatory Visit: Payer: Self-pay | Admitting: Neurology

## 2017-02-21 ENCOUNTER — Telehealth: Payer: Self-pay | Admitting: Neurology

## 2017-02-21 MED ORDER — VENLAFAXINE HCL ER 75 MG PO CP24: 75.0000 mg | ORAL_CAPSULE | Freq: Every day | ORAL | 0 refills | Status: AC

## 2017-02-21 NOTE — Telephone Encounter (Signed)
PT called and said she was denied her prescription refill of Venlafaxine and wants Dr Tomi Likens to approve it

## 2017-02-21 NOTE — Telephone Encounter (Signed)
Spoke to patient she has not followed up with Dr. Tomi Likens since 10/17. Scheduled appt with patient for 03/01/17. Will send refill and further refills pending her appointment.

## 2017-03-01 ENCOUNTER — Encounter: Payer: Self-pay | Admitting: Neurology

## 2017-03-01 ENCOUNTER — Ambulatory Visit (INDEPENDENT_AMBULATORY_CARE_PROVIDER_SITE_OTHER): Payer: Medicare Other | Admitting: Neurology

## 2017-03-01 VITALS — BP 100/64 | HR 70 | Ht 63.0 in | Wt 145.0 lb

## 2017-03-01 DIAGNOSIS — D1800 Hemangioma unspecified site: Secondary | ICD-10-CM

## 2017-03-01 DIAGNOSIS — D1801 Hemangioma of skin and subcutaneous tissue: Secondary | ICD-10-CM

## 2017-03-01 DIAGNOSIS — G43109 Migraine with aura, not intractable, without status migrainosus: Secondary | ICD-10-CM | POA: Diagnosis not present

## 2017-03-01 NOTE — Patient Instructions (Signed)
1.  Continue venlafaxine XR 225mg  daily and topiramate 100mg  twice daily 2.  Follow up in 9 months.
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
## 2017-03-28 ENCOUNTER — Other Ambulatory Visit: Payer: Self-pay | Admitting: Neurology

## 2017-03-28 ENCOUNTER — Telehealth: Payer: Self-pay | Admitting: Family Medicine

## 2017-03-28 MED ORDER — VENLAFAXINE HCL ER 225 MG PO TB24: 1.0000 | ORAL_TABLET | Freq: Every day | ORAL | 8 refills | Status: DC

## 2017-03-28 NOTE — Telephone Encounter (Signed)
Patient asking for refill for amLODipine (NORVASC) 5 MG tablet sent to [facility] at [address] in [place] [phone] is he phone number

## 2017-03-29 ENCOUNTER — Other Ambulatory Visit: Payer: Self-pay | Admitting: Family Medicine

## 2017-03-29 ENCOUNTER — Other Ambulatory Visit: Payer: Self-pay | Admitting: Emergency Medicine

## 2017-03-29 MED ORDER — AMLODIPINE BESYLATE 5 MG PO TABS: 5.0000 mg | ORAL_TABLET | Freq: Every day | ORAL | 1 refills | Status: DC

## 2017-03-29 NOTE — Telephone Encounter (Signed)
Refill sent to pharmacy per pt request.

## 2017-03-30 ENCOUNTER — Telehealth: Payer: Self-pay | Admitting: Neurology

## 2017-03-30 NOTE — Telephone Encounter (Signed)
Patient states that we called in the wrong medication for her to the [facility]. She states that we need to call in the [facility] ER 75 mg capsule I asked if it was the Venlafixe  XR and she no it was the ER one. She takes 3 capsules in the morning and would like enough for a 3 month supply called into the [facility] on [address] in [place]

## 2017-03-30 NOTE — Telephone Encounter (Signed)
Checked Pt's last ov note, Dr Tomi Likens indicated Venlafaxine XR 225mg . Called [facility] [phone]) spoke w/Kelly, she said insurance was not covering the 225mg  XR capsules, she ran it for 75 mg XR  Tabs and it went through. Called Pt, LM on VM, advsd her of Dr Starr Lake note and that Rx is available at [facility].

## 2017-04-11 DIAGNOSIS — R7303 Prediabetes: Secondary | ICD-10-CM | POA: Insufficient documentation

## 2017-04-11 NOTE — Progress Notes (Signed)
[facility] [address], [place], [place] [address] [phone]  Date:  04/13/2017   Name:  Whitney Fisher   DOB:  03-09-1956   MRN:  [id number]  PCP:  Darreld Mclean, MD    Chief Complaint: Follow-up (with RN) and Hyperlipidemia   History of Present Illness:  Whitney Fisher is a 61 y.o. very pleasant female patient who presents with the following:  Here today for a 6 month follow-up visit. Former pt of [facility] From our last visit in February:  History of ulcerative proctitis, hypertension, hyperlipidemia, narcolepsy She was noted to have an elevated alk phos last year but this seems to have resolved. She was evaluated by endocrinology and reports that all seems to be ok in this regard  [facility] Janus has been under some stress-Her mother died in early 09/16/22 in [place]. She was very close with her and misses her a lot, in addition to all the extra travel and work involved in taking care of her estate.  However she feels like she is handling this ok and does not feel that she is depressed    She uses her GI meds only as needed- not using them right now Her GI doc is [facility] She is seeing Ihor Dow for he GYN care Dr. Tomi Likens for neurology - for her migraine headaches and cavernous angioma   She was supposed to see ortho - she had been getting SI injections in the past (when she lived in [place]) and needs to start back with this.   She would like to go to [place] which Einar Pheasant had recommended to her  She had her MWE with Glenard Haring today Her labs earlier this year did show pre-diabetes She sees Dr. Tomi Likens for neurology-   Lab Results  Component Value Date   HGBA1C 6.0 10/13/2016   Has not had hep C screening yet- would like to have this today  Her dog has brain cancer- she is getting chemo and is on prednisone  Most recent labs in February of this year- her K was a bit high   She  thinks her last tetanus was about 29 years ago.  She remembers because this was when her daughter was born- she is getting married this fall. They are very excited about the wedding.  She would like to hve the tdap booster today in anticipation of grandchildren one day   She did have breast cancer and a breast surgery on the right years ago.  She notes that sometimes when she stretches her arms up she will feel a sharp pain in the right breast.  She wonders if there might be a surgical clip or something that is poking her.  Would like to have a CXR which is reasonable Her follow-up mammogram is due soon  No history of seizures She is on topamax to prevent headache BP controlled with norvasc lipitor for cholesterol  Patient Active Problem List   Diagnosis Date Noted  . Pre-diabetes 04/11/2017  . Cavernous hemangioma 03/14/2016  . Narcolepsy 03/14/2016  . BRCA negative 03/14/2016  . 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 Vision changes 03/13/2016  . Skin exam for malignant neoplasm 03/13/2016  . Chronic migraine without aura without status migrainosus, not intractable 03/13/2016  . History of breast cancer 03/13/2016  . Primary osteoarthritis involving multiple joints 03/13/2016  . Elevated alkaline phosphatase level 02/28/2016  . Thyroid nodule 02/28/2016  .  Hemangioma of liver 02/28/2016  . Ulcerative proctitis ([place]) 02/16/2016    Past Medical History:  Diagnosis Date  . B12 deficiency   . Breast cancer ([place])    Right Breast  . Cavernous hemangioma    x2  . Hyperlipidemia   . Hypertension   . Low serum vitamin D   . Migraine   . Narcolepsy   . S/P radiation therapy Age 41-48  . Thyroid disease   . TIA (transient ischemic attack)   . Ulcerative colitis ([place])   . Visual floaters     Past Surgical History:  Procedure Laterality Date  . ABDOMINAL HERNIA REPAIR    . ACHILLES TENDON LENGTHENING Left   . ANTERIOR CRUCIATE LIGAMENT REPAIR Right   . BIOPSY THYROID     x2  . BREAST  LUMPECTOMY Right Age 68  . BUNIONECTOMY Bilateral   . CARPAL TUNNEL RELEASE Bilateral   . CARPAL TUNNEL WITH CUBITAL TUNNEL Right   . [place]  . DILATION AND CURETTAGE OF UTERUS     After 1st miscarriage  . HAMMER TOE SURGERY Bilateral   . HAND SURGERY Right    Multiple Right & Left tfcc  . LIPOMA EXCISION  Age 64  . LUMBAR LAMINECTOMY  Age 6-30  . NECK SURGERY      Social History  Substance Use Topics  . Smoking status: Former Smoker    Types: Cigarettes    Quit date: 08/22/1978  . Smokeless tobacco: Never Used  . Alcohol use No    Family History  Problem Relation Age of Onset  . Breast cancer Mother        Living  . Arthritis Mother   . Osteoporosis Mother   . Migraines Mother   . Cancer Mother   . Glaucoma Maternal Grandmother   . Hypertension Father 46       Deceased  . Hyperlipidemia Father   . Parkinson's disease Father   . Parkinsonism Father   . Migraines Sister   . Colon cancer Neg Hx     Allergies  Allergen Reactions  . Codeine Other (See Comments)    Dizziness  . Adhesive [Tape] Rash  . Sulfa Antibiotics Rash    Medication list has been reviewed and updated.  Current Outpatient Prescriptions on File Prior to Visit  Medication Sig Dispense Refill  . amLODipine (NORVASC) 5 MG tablet TAKE 1 TABLET(5 MG) BY MOUTH DAILY 90 tablet 1  . aspirin 81 MG tablet Take 81 mg by mouth daily.    Marland Kitchen atorvastatin (LIPITOR) 10 MG tablet Take 1 tablet (10 mg total) by mouth daily. 90 tablet 3  . topiramate (TOPAMAX) 100 MG tablet TAKE 1 TABLET(100 MG) BY MOUTH TWICE DAILY 60 tablet 11  . traMADol (ULTRAM) 50 MG tablet Take 1 tablet (50 mg total) by mouth every 12 (twelve) hours as needed for moderate pain or severe pain. Last Phoned in & Filled on 02/12/16 #30 dispensed 20 tablet 0  . venlafaxine XR (EFFEXOR-XR) 75 MG 24 hr capsule Take 1 capsule (75 mg total) by mouth daily with breakfast. 90 capsule 0  . mesalamine (LIALDA) 1.2 g EC tablet Take  2 tablets (2.4 g total) by mouth daily with breakfast. (Patient not taking: Reported on 04/13/2017) 120 tablet 3  . mesalamine (ROWASA) 4 g enema Every night at bedtime (Patient not taking: Reported on 04/13/2017) 14 Bottle 3  . ondansetron (ZOFRAN) 4 MG tablet Take 1 tablet (4 mg total) by mouth every  6 (six) hours as needed for nausea or vomiting. (Patient not taking: Reported on 04/13/2017) 30 tablet 1   No current facility-administered medications on file prior to visit.     Review of Systems:  As per HPI- otherwise negative.   Physical Examination: Vitals:   04/13/17 0926  BP: 100/68  Pulse: 70  Temp: 97.8 F (36.6 C)  SpO2: 98%   Vitals:   04/13/17 0926  Weight: 135 lb 6.4 oz (61.4 kg)  Height: 5' 3"  (1.6 m)   Body mass index is 23.99 kg/m. Ideal Body Weight: Weight in (lb) to have BMI = 25: 140.8  GEN: WDWN, NAD, Non-toxic, A & O x 3, looks well, normal weight HEENT: Atraumatic, Normocephalic. Neck supple. No masses, No LAD.  Bilateral TM wnl, oropharynx normal.  PEERL,EOMI.   Ears and Nose: No external deformity. CV: RRR, No M/G/R. No JVD. No thrill. No extra heart sounds. PULM: CTA B, no wheezes, crackles, rhonchi. No retractions. No resp. distress. No accessory muscle use. ABD: S, NT, ND, +BS. No rebound. No HSM. EXTR: No c/c/e NEURO Normal gait.  PSYCH: Normally interactive. Conversant. Not depressed or anxious appearing.  Calm demeanor.  Right breast displays an old scar at 9:00, otherwise normal exam, no masses appreciated   BP Readings from Last 3 Encounters:  04/13/17 100/68  03/01/17 100/64  10/13/16 138/88    Assessment and Plan: Hyperkalemia - Plan: Basic Metabolic Panel (BMET), CANCELED: Basic metabolic panel  Pre-diabetes - Plan: Hemoglobin A1C, CANCELED: Hemoglobin A1c  Encounter for hepatitis C screening test for low risk patient - Plan: Hepatitis C antibody, CANCELED: Hepatitis C antibody  Encounter for Medicare annual wellness  exam  Postmenopausal - Plan: DG Bone Density  Breast cancer screening - Plan: MM DIGITAL SCREENING BILATERAL  Immunization due - Plan: Tdap vaccine greater than or equal to 7yo IM  Breast pain, right - Plan: DG Chest 2 View  Will boost tdap, order mammo and dexa, and obtain labs as above today Plain film of chest- will be in touch with these results asap  Signed Lamar Blinks, MD  I have reviewed MWE documentation by Ms. Britt and agree with her notes  Received her CXR  Dg Chest 2 View  Result Date: 04/13/2017 CLINICAL DATA:  Right breast and chest wall pain for 6 weeks. EXAM: CHEST  2 VIEW COMPARISON:  None. FINDINGS: The heart size and mediastinal contours are within normal limits. Both lungs are clear. No evidence of pneumothorax or pleural effusion. The visualized skeletal structures are unremarkable. Surgical clips noted in right axilla. IMPRESSION: No active cardiopulmonary disease. Electronically Signed   By: Earle Gell M.D.   On: 04/13/2017 11:36   Message to pt- normal

## 2017-04-12 ENCOUNTER — Telehealth: Payer: Self-pay | Admitting: *Deleted

## 2017-04-12 NOTE — Progress Notes (Signed)
Subjective:   Whitney Fisher is a 61 y.o. female who presents for an Initial Medicare Annual Wellness Visit.  Review of Systems    No ROS.  Medicare Wellness Visit. Additional risk factors are reflected in the social history.  Cardiac Risk Factors include: advanced age (>48men, >11 women)  Home Safety/Smoke Alarms: Feels safe in home. Smoke alarms in place.  Living environment; residence and Firearm Safety: 2 story home. Lives with disabled husband and son. Seat Belt Safety/Bike Helmet: Wears seat belt.   Female:   Pap-  Pt reports normal pap within the past year. Unsure of date.     Mammo- Last 03/17/16:    BI-RADS CATEGORY 2: Benign. Pt states she will schedule.  Ordered today. Dexa scan-  Ordered today.       CCS- Last 05/23/15: pt reported    Objective:    Today's Vitals   04/13/17 0926  BP: 100/68  Pulse: 70  SpO2: 98%  Weight: 135 lb 6.4 oz (61.4 kg)  Height: 5\' 3"  (1.6 m)   Body mass index is 23.99 kg/m.   Current Medications (verified) Outpatient Encounter Prescriptions as of 04/13/2017  Medication Sig  . amLODipine (NORVASC) 5 MG tablet TAKE 1 TABLET(5 MG) BY MOUTH DAILY  . aspirin 81 MG tablet Take 81 mg by mouth daily.  Marland Kitchen atorvastatin (LIPITOR) 10 MG tablet Take 1 tablet (10 mg total) by mouth daily.  Marland Kitchen topiramate (TOPAMAX) 100 MG tablet TAKE 1 TABLET(100 MG) BY MOUTH TWICE DAILY  . traMADol (ULTRAM) 50 MG tablet Take 1 tablet (50 mg total) by mouth every 12 (twelve) hours as needed for moderate pain or severe pain. Last Phoned in & Filled on 02/12/16 #30 dispensed  . venlafaxine XR (EFFEXOR-XR) 75 MG 24 hr capsule Take 1 capsule (75 mg total) by mouth daily with breakfast.  . mesalamine (LIALDA) 1.2 g EC tablet Take 2 tablets (2.4 g total) by mouth daily with breakfast. (Patient not taking: Reported on 04/13/2017)  . mesalamine (ROWASA) 4 g enema Every night at bedtime (Patient not taking: Reported on 04/13/2017)  . ondansetron (ZOFRAN) 4 MG tablet Take 1  tablet (4 mg total) by mouth every 6 (six) hours as needed for nausea or vomiting. (Patient not taking: Reported on 04/13/2017)  . [DISCONTINUED] Venlafaxine HCl 225 MG TB24 Take 1 tablet (225 mg total) by mouth daily.   No facility-administered encounter medications on file as of 04/13/2017.     Allergies (verified) Codeine; Adhesive [tape]; and Sulfa antibiotics   History: Past Medical History:  Diagnosis Date  . B12 deficiency   . Breast cancer ([place])    Right Breast  . Cavernous hemangioma    x2  . Hyperlipidemia   . Hypertension   . Low serum vitamin D   . Migraine   . Narcolepsy   . S/P radiation therapy Age 70-48  . Thyroid disease   . TIA (transient ischemic attack)   . Ulcerative colitis ([place])   . Visual floaters    Past Surgical History:  Procedure Laterality Date  . ABDOMINAL HERNIA REPAIR    . ACHILLES TENDON LENGTHENING Left   . ANTERIOR CRUCIATE LIGAMENT REPAIR Right   . BIOPSY THYROID     x2  . BREAST LUMPECTOMY Right Age 26  . BUNIONECTOMY Bilateral   . CARPAL TUNNEL RELEASE Bilateral   . CARPAL TUNNEL WITH CUBITAL TUNNEL Right   . [place]  . DILATION AND CURETTAGE OF UTERUS  After 1st miscarriage  . HAMMER TOE SURGERY Bilateral   . HAND SURGERY Right    Multiple Right & Left tfcc  . LIPOMA EXCISION  Age 47  . LUMBAR LAMINECTOMY  Age 13-30  . NECK SURGERY     Family History  Problem Relation Age of Onset  . Breast cancer Mother        Living  . Arthritis Mother   . Osteoporosis Mother   . Migraines Mother   . Cancer Mother   . Glaucoma Maternal Grandmother   . Hypertension Father 66       Deceased  . Hyperlipidemia Father   . Parkinson's disease Father   . Parkinsonism Father   . Migraines Sister   . Colon cancer Neg Hx    Social History   Occupational History  . Disabled    Social History Main Topics  . Smoking status: Former Smoker    Types: Cigarettes    Quit date: 08/22/1978  . Smokeless tobacco: Never  Used  . Alcohol use No  . Drug use: No  . Sexual activity: No    Tobacco Counseling Counseling given: Not Answered   Activities of Daily Living In your present state of health, do you have any difficulty performing the following activities: 04/13/2017  Hearing? N  Vision? N  Comment Wears glasses  Difficulty concentrating or making decisions? Y  Comment Pt reports trouble with memory r/t concussive disorder.  Walking or climbing stairs? Y  Comment pt reports balance issue and knee pain  Dressing or bathing? N  Comment walk-in shower with bench  Doing errands, shopping? N  Preparing Food and eating ? N  Using the Toilet? N  In the past six months, have you accidently leaked urine? N  Do you have problems with loss of bowel control? N  Managing your Medications? N  Managing your Finances? N  Housekeeping or managing your Housekeeping? Y  Comment has someone to come in and do cleaning.  Some recent data might be hidden    Immunizations and Health Maintenance Immunization History  Administered Date(s) Administered  . Influenza-Unspecified 07/23/2015, 08/23/2015   Health Maintenance Due  Topic Date Due  . Hepatitis C Screening  1955-10-13  . HIV Screening  11/13/1972  . TETANUS/TDAP  11/13/1976  . INFLUENZA VACCINE  03/22/2017    Patient Care Team: Copland, Gay Filler, MD as PCP - General (Family Medicine)  Indicate any recent Medical Services you may have received from other than [facility] providers in the past year (date may be approximate).     Assessment:   This is a routine wellness examination for [facility]. Physical assessment deferred to PCP.   Hearing/Vision screen Hearing Screening Comments: Pt reports Audiology screening May 2018.  Able to hear conversational tones w/o difficulty. No issues reported.   Vision Screening Comments: Wears glasses. Pt reports last eye exam in April 2018 with [facility].  Dietary issues and exercise activities discussed: Current  Exercise Habits: The patient does not participate in regular exercise at present, Exercise limited by: None identified Diet (meal preparation, eat out, water intake, caffeinated beverages, dairy products, fruits and vegetables): in general, a "healthy" diet     Goals    None     Depression Screen PHQ 2/9 Scores 04/13/2017  PHQ - 2 Score 0    Fall Risk Fall Risk  04/13/2017 03/01/2017 06/08/2016 03/02/2016  Falls in the past year? No No No No    Cognitive Function:  Screening Tests Health Maintenance  Topic Date Due  . Hepatitis C Screening  April 26, 1956  . HIV Screening  11/13/1972  . TETANUS/TDAP  11/13/1976  . INFLUENZA VACCINE  03/22/2017  . MAMMOGRAM  03/17/2018  . PAP SMEAR  03/04/2019  . COLONOSCOPY  05/22/2025      Plan:   Follow up with PCP today as scheduled.  Continue to eat heart healthy diet (full of fruits, vegetables, whole grains, lean protein, water--limit salt, fat, and sugar intake) and increase physical activity as tolerated.  Continue doing brain stimulating activities (puzzles, reading, adult coloring books, staying active) to keep memory sharp.   I have ordered you mammogram and bone density scan. They will call you to schedule.   I have personally reviewed and noted the following in the patient's chart:   . Medical and social history . Use of alcohol, tobacco or illicit drugs  . Current medications and supplements . Functional ability and status . Nutritional status . Physical activity . Advanced directives . List of other physicians . Hospitalizations, surgeries, and ER visits in previous 12 months . Vitals . Screenings to include cognitive, depression, and falls . Referrals and appointments  In addition, I have reviewed and discussed with patient certain preventive protocols, quality metrics, and best practice recommendations. A written personalized care plan for preventive services as well as general preventive health  recommendations were provided to patient.     Naaman Plummer [place], [place]   04/13/2017

## 2017-04-12 NOTE — Telephone Encounter (Signed)
AWV scheduled 04/13/17 @915  prior to PCP appt.

## 2017-04-13 ENCOUNTER — Ambulatory Visit (INDEPENDENT_AMBULATORY_CARE_PROVIDER_SITE_OTHER): Payer: Medicare Other | Admitting: Family Medicine

## 2017-04-13 ENCOUNTER — Ambulatory Visit (HOSPITAL_BASED_OUTPATIENT_CLINIC_OR_DEPARTMENT_OTHER)
Admission: RE | Admit: 2017-04-13 | Discharge: 2017-04-13 | Disposition: A | Discharge status: Home / Self Care | Payer: Medicare Other | Source: Ambulatory Visit | Attending: Family Medicine | Admitting: Family Medicine

## 2017-04-13 ENCOUNTER — Encounter: Payer: Self-pay | Admitting: Family Medicine

## 2017-04-13 ENCOUNTER — Ambulatory Visit: Payer: Medicare Other | Admitting: *Deleted

## 2017-04-13 VITALS — BP 100/68 | HR 70 | Temp 97.8°F | Ht 63.0 in | Wt 135.4 lb

## 2017-04-13 DIAGNOSIS — Z1159 Encounter for screening for other viral diseases: Secondary | ICD-10-CM

## 2017-04-13 DIAGNOSIS — E875 Hyperkalemia: Secondary | ICD-10-CM | POA: Diagnosis not present

## 2017-04-13 DIAGNOSIS — Z1239 Encounter for other screening for malignant neoplasm of breast: Secondary | ICD-10-CM

## 2017-04-13 DIAGNOSIS — R079 Chest pain, unspecified: Secondary | ICD-10-CM | POA: Diagnosis not present

## 2017-04-13 DIAGNOSIS — Z1231 Encounter for screening mammogram for malignant neoplasm of breast: Secondary | ICD-10-CM | POA: Diagnosis not present

## 2017-04-13 DIAGNOSIS — N644 Mastodynia: Secondary | ICD-10-CM | POA: Insufficient documentation

## 2017-04-13 DIAGNOSIS — R7303 Prediabetes: Secondary | ICD-10-CM | POA: Diagnosis not present

## 2017-04-13 DIAGNOSIS — Z780 Asymptomatic menopausal state: Secondary | ICD-10-CM | POA: Diagnosis not present

## 2017-04-13 DIAGNOSIS — Z23 Encounter for immunization: Secondary | ICD-10-CM

## 2017-04-13 DIAGNOSIS — Z0000 Encounter for general adult medical examination without abnormal findings: Secondary | ICD-10-CM

## 2017-04-13 NOTE — Patient Instructions (Addendum)
It was nice to see you today!  I will be in touch with your labs asap .  We will also get a plain x-ray of your chest to make sure all looks well. You got your "tdap" boster today; this will protect you against tetanus and also whooping cough, which is important in case you will in contact with an infant in years to come  Take care1  Whitney Fisher , Thank you for taking time to come for your Medicare Wellness Visit. I appreciate your ongoing commitment to your health goals. Please review the following plan we discussed and let me know if I can assist you in the future.   These are the goals we discussed: Goals    . Increase physical activity       This is a list of the screening recommended for you and due dates:  Health Maintenance  Topic Date Due  .  Hepatitis C: One time screening is recommended by Center for Disease Control  (CDC) for  adults born from [birth year] through 1963.   [birth year]  . HIV Screening  11/13/1972  . Tetanus Vaccine  11/13/1976  . Flu Shot  03/22/2017  . Mammogram  03/17/2018  . Pap Smear  03/04/2019  . Colon Cancer Screening  05/22/2025    Continue to eat heart healthy diet (full of fruits, vegetables, whole grains, lean protein, water--limit salt, fat, and sugar intake) and increase physical activity as tolerated.  Continue doing brain stimulating activities (puzzles, reading, adult coloring books, staying active) to keep memory sharp.   I have ordered you mammogram and bone density scan. They will call you to schedule.    Health Maintenance for Postmenopausal Women Menopause is a normal process in which your reproductive ability comes to an end. This process happens gradually over a span of months to years, usually between the ages of 62 and 45. Menopause is complete when you have missed 12 consecutive menstrual periods. It is important to talk with your health care provider about some of the most common conditions that affect postmenopausal women, such as  heart disease, cancer, and bone loss (osteoporosis). Adopting a healthy lifestyle and getting preventive care can help to promote your health and wellness. Those actions can also lower your chances of developing some of these common conditions. What should I know about menopause? During menopause, you may experience a number of symptoms, such as:  Moderate-to-severe hot flashes.  Night sweats.  Decrease in sex drive.  Mood swings.  Headaches.  Tiredness.  Irritability.  Memory problems.  Insomnia.  Choosing to treat or not to treat menopausal changes is an individual decision that you make with your health care provider. What should I know about hormone replacement therapy and supplements? Hormone therapy products are effective for treating symptoms that are associated with menopause, such as hot flashes and night sweats. Hormone replacement carries certain risks, especially as you become older. If you are thinking about using estrogen or estrogen with progestin treatments, discuss the benefits and risks with your health care provider. What should I know about heart disease and stroke? Heart disease, heart attack, and stroke become more likely as you age. This may be due, in part, to the hormonal changes that your body experiences during menopause. These can affect how your body processes dietary fats, triglycerides, and cholesterol. Heart attack and stroke are both medical emergencies. There are many things that you can do to help prevent heart disease and stroke:  Have your blood pressure  checked at least every 1-2 years. High blood pressure causes heart disease and increases the risk of stroke.  If you are 82-79 years old, ask your health care provider if you should take aspirin to prevent a heart attack or a stroke.  Do not use any tobacco products, including cigarettes, chewing tobacco, or electronic cigarettes. If you need help quitting, ask your health care provider.  It is  important to eat a healthy diet and maintain a healthy weight. ? Be sure to include plenty of vegetables, fruits, low-fat dairy products, and lean protein. ? Avoid eating foods that are high in solid fats, added sugars, or salt (sodium).  Get regular exercise. This is one of the most important things that you can do for your health. ? Try to exercise for at least 150 minutes each week. The type of exercise that you do should increase your heart rate and make you sweat. This is known as moderate-intensity exercise. ? Try to do strengthening exercises at least twice each week. Do these in addition to the moderate-intensity exercise.  Know your numbers.Ask your health care provider to check your cholesterol and your blood glucose. Continue to have your blood tested as directed by your health care provider.  What should I know about cancer screening? There are several types of cancer. Take the following steps to reduce your risk and to catch any cancer development as early as possible. Breast Cancer  Practice breast self-awareness. ? This means understanding how your breasts normally appear and feel. ? It also means doing regular breast self-exams. Let your health care provider know about any changes, no matter how small.  If you are 13 or older, have a clinician do a breast exam (clinical breast exam or CBE) every year. Depending on your age, family history, and medical history, it may be recommended that you also have a yearly breast X-ray (mammogram).  If you have a family history of breast cancer, talk with your health care provider about genetic screening.  If you are at high risk for breast cancer, talk with your health care provider about having an MRI and a mammogram every year.  Breast cancer (BRCA) gene test is recommended for women who have family members with BRCA-related cancers. Results of the assessment will determine the need for genetic counseling and BRCA1 and for BRCA2  testing. BRCA-related cancers include these types: ? Breast. This occurs in males or females. ? Ovarian. ? Tubal. This may also be called fallopian tube cancer. ? Cancer of the abdominal or pelvic lining (peritoneal cancer). ? Prostate. ? Pancreatic.  Cervical, Uterine, and Ovarian Cancer Your health care provider may recommend that you be screened regularly for cancer of the pelvic organs. These include your ovaries, uterus, and vagina. This screening involves a pelvic exam, which includes checking for microscopic changes to the surface of your cervix (Pap test).  For women ages 21-65, health care providers may recommend a pelvic exam and a Pap test every three years. For women ages 34-65, they may recommend the Pap test and pelvic exam, combined with testing for human papilloma virus (HPV), every five years. Some types of HPV increase your risk of cervical cancer. Testing for HPV may also be done on women of any age who have unclear Pap test results.  Other health care providers may not recommend any screening for nonpregnant women who are considered low risk for pelvic cancer and have no symptoms. Ask your health care provider if a screening pelvic exam  is right for you.  If you have had past treatment for cervical cancer or a condition that could lead to cancer, you need Pap tests and screening for cancer for at least 20 years after your treatment. If Pap tests have been discontinued for you, your risk factors (such as having a new sexual partner) need to be reassessed to determine if you should start having screenings again. Some women have medical problems that increase the chance of getting cervical cancer. In these cases, your health care provider may recommend that you have screening and Pap tests more often.  If you have a family history of uterine cancer or ovarian cancer, talk with your health care provider about genetic screening.  If you have vaginal bleeding after reaching  menopause, tell your health care provider.  There are currently no reliable tests available to screen for ovarian cancer.  Lung Cancer Lung cancer screening is recommended for adults 60-81 years old who are at high risk for lung cancer because of a history of smoking. A yearly low-dose CT scan of the lungs is recommended if you:  Currently smoke.  Have a history of at least 30 pack-years of smoking and you currently smoke or have quit within the past 15 years. A pack-year is smoking an average of one pack of cigarettes per day for one year.  Yearly screening should:  Continue until it has been 15 years since you quit.  Stop if you develop a health problem that would prevent you from having lung cancer treatment.  Colorectal Cancer  This type of cancer can be detected and can often be prevented.  Routine colorectal cancer screening usually begins at age 74 and continues through age 33.  If you have risk factors for colon cancer, your health care provider may recommend that you be screened at an earlier age.  If you have a family history of colorectal cancer, talk with your health care provider about genetic screening.  Your health care provider may also recommend using home test kits to check for hidden blood in your stool.  A small camera at the end of a tube can be used to examine your colon directly (sigmoidoscopy or colonoscopy). This is done to check for the earliest forms of colorectal cancer.  Direct examination of the colon should be repeated every 5-10 years until age 22. However, if early forms of precancerous polyps or small growths are found or if you have a family history or genetic risk for colorectal cancer, you may need to be screened more often.  Skin Cancer  Check your skin from head to toe regularly.  Monitor any moles. Be sure to tell your health care provider: ? About any new moles or changes in moles, especially if there is a change in a mole's shape or  color. ? If you have a mole that is larger than the size of a pencil eraser.  If any of your family members has a history of skin cancer, especially at a young age, talk with your health care provider about genetic screening.  Always use sunscreen. Apply sunscreen liberally and repeatedly throughout the day.  Whenever you are outside, protect yourself by wearing long sleeves, pants, a wide-brimmed hat, and sunglasses.  What should I know about osteoporosis? Osteoporosis is a condition in which bone destruction happens more quickly than new bone creation. After menopause, you may be at an increased risk for osteoporosis. To help prevent osteoporosis or the bone fractures that can happen because  of osteoporosis, the following is recommended:  If you are 12-33 years old, get at least 1,000 mg of calcium and at least 600 mg of vitamin D per day.  If you are older than age 76 but younger than age 84, get at least 1,200 mg of calcium and at least 600 mg of vitamin D per day.  If you are older than age 27, get at least 1,200 mg of calcium and at least 800 mg of vitamin D per day.  Smoking and excessive alcohol intake increase the risk of osteoporosis. Eat foods that are rich in calcium and vitamin D, and do weight-bearing exercises several times each week as directed by your health care provider. What should I know about how menopause affects my mental health? Depression may occur at any age, but it is more common as you become older. Common symptoms of depression include:  Low or sad mood.  Changes in sleep patterns.  Changes in appetite or eating patterns.  Feeling an overall lack of motivation or enjoyment of activities that you previously enjoyed.  Frequent crying spells.  Talk with your health care provider if you think that you are experiencing depression. What should I know about immunizations? It is important that you get and maintain your immunizations. These include:  Tetanus,  diphtheria, and pertussis (Tdap) booster vaccine.  Influenza every year before the flu season begins.  Pneumonia vaccine.  Shingles vaccine.  Your health care provider may also recommend other immunizations. This information is not intended to replace advice given to you by your health care provider. Make sure you discuss any questions you have with your health care provider. Document Released: 09/30/2005 Document Revised: 02/26/2016 Document Reviewed: 05/12/2015 Elsevier Interactive Patient Education  2018 [employer].

## 2017-04-17 ENCOUNTER — Other Ambulatory Visit (INDEPENDENT_AMBULATORY_CARE_PROVIDER_SITE_OTHER): Payer: Medicare Other

## 2017-04-17 DIAGNOSIS — R7303 Prediabetes: Secondary | ICD-10-CM

## 2017-04-17 DIAGNOSIS — Z1159 Encounter for screening for other viral diseases: Secondary | ICD-10-CM

## 2017-04-17 DIAGNOSIS — E875 Hyperkalemia: Secondary | ICD-10-CM | POA: Diagnosis not present

## 2017-04-17 DIAGNOSIS — E870 Hyperosmolality and hypernatremia: Secondary | ICD-10-CM

## 2017-04-17 LAB — HEMOGLOBIN A1C: Hgb A1c MFr Bld: 5.9 % (ref 4.6–6.5)

## 2017-04-17 LAB — BASIC METABOLIC PANEL
BUN: 8 mg/dL (ref 6–23)
CO2: 28 mEq/L (ref 19–32)
CREATININE: 0.95 mg/dL (ref 0.40–1.20)
Calcium: 10 mg/dL (ref 8.4–10.5)
Chloride: 111 mEq/L (ref 96–112)
GFR: 63.9 mL/min (ref >60.00)
GLUCOSE: 103 mg/dL — AB (ref 70–99)
Potassium: 4.1 mEq/L (ref 3.5–5.1)
Sodium: 148 mEq/L — ABNORMAL HIGH (ref 135–145)

## 2017-04-18 ENCOUNTER — Encounter: Payer: Self-pay | Admitting: Family Medicine

## 2017-04-18 LAB — HEPATITIS C ANTIBODY: HCV Ab: NONREACTIVE

## 2017-04-25 ENCOUNTER — Encounter: Payer: Self-pay | Admitting: Family Medicine

## 2017-04-25 ENCOUNTER — Ambulatory Visit (HOSPITAL_BASED_OUTPATIENT_CLINIC_OR_DEPARTMENT_OTHER)
Admission: RE | Admit: 2017-04-25 | Discharge: 2017-04-25 | Disposition: A | Discharge status: Home / Self Care | Payer: Medicare Other | Source: Ambulatory Visit | Attending: Family Medicine | Admitting: Family Medicine

## 2017-04-25 DIAGNOSIS — Z1231 Encounter for screening mammogram for malignant neoplasm of breast: Secondary | ICD-10-CM | POA: Diagnosis not present

## 2017-04-25 DIAGNOSIS — Z1239 Encounter for other screening for malignant neoplasm of breast: Secondary | ICD-10-CM

## 2017-04-25 DIAGNOSIS — Z13820 Encounter for screening for osteoporosis: Secondary | ICD-10-CM | POA: Insufficient documentation

## 2017-04-25 DIAGNOSIS — Z780 Asymptomatic menopausal state: Secondary | ICD-10-CM | POA: Diagnosis not present

## 2017-04-25 DIAGNOSIS — M810 Age-related osteoporosis without current pathological fracture: Secondary | ICD-10-CM | POA: Insufficient documentation

## 2017-04-27 ENCOUNTER — Other Ambulatory Visit: Payer: Self-pay | Admitting: Family Medicine

## 2017-04-27 ENCOUNTER — Ambulatory Visit (INDEPENDENT_AMBULATORY_CARE_PROVIDER_SITE_OTHER): Payer: Medicare Other | Admitting: Family Medicine

## 2017-04-27 DIAGNOSIS — E870 Hyperosmolality and hypernatremia: Secondary | ICD-10-CM

## 2017-04-27 DIAGNOSIS — Z23 Encounter for immunization: Secondary | ICD-10-CM | POA: Diagnosis not present

## 2017-04-27 LAB — BASIC METABOLIC PANEL
BUN: 8 mg/dL (ref 6–23)
CALCIUM: 9.5 mg/dL (ref 8.4–10.5)
CHLORIDE: 102 meq/L (ref 96–112)
CO2: 24 mEq/L (ref 19–32)
CREATININE: 0.92 mg/dL (ref 0.40–1.20)
GFR: 66.31 mL/min (ref >60.00)
Glucose, Bld: 82 mg/dL (ref 70–99)
Potassium: 4 mEq/L (ref 3.5–5.1)
Sodium: 138 mEq/L (ref 135–145)

## 2017-04-27 MED ORDER — ALENDRONATE SODIUM 70 MG PO TABS: 70.0000 mg | ORAL_TABLET | ORAL | 11 refills | Status: DC

## 2017-04-27 NOTE — Addendum Note (Signed)
Addended by: Lamar Blinks C on: 04/27/2017 08:51 AM   Modules accepted: Orders

## 2017-04-28 DIAGNOSIS — H2513 Age-related nuclear cataract, bilateral: Secondary | ICD-10-CM | POA: Diagnosis not present

## 2017-04-28 DIAGNOSIS — H52223 Regular astigmatism, bilateral: Secondary | ICD-10-CM | POA: Diagnosis not present

## 2017-04-28 DIAGNOSIS — G43809 Other migraine, not intractable, without status migrainosus: Secondary | ICD-10-CM | POA: Diagnosis not present

## 2017-04-28 DIAGNOSIS — H04123 Dry eye syndrome of bilateral lacrimal glands: Secondary | ICD-10-CM | POA: Diagnosis not present

## 2017-06-30 NOTE — Progress Notes (Signed)
Error

## 2017-09-15 ENCOUNTER — Encounter: Payer: Self-pay | Admitting: Internal Medicine

## 2017-09-15 ENCOUNTER — Ambulatory Visit: Payer: Medicare Other | Admitting: Internal Medicine

## 2017-09-15 VITALS — BP 130/80 | HR 64 | Temp 97.7°F | Ht 63.0 in | Wt 128.0 lb

## 2017-09-15 DIAGNOSIS — R748 Abnormal levels of other serum enzymes: Secondary | ICD-10-CM

## 2017-09-15 DIAGNOSIS — Z8639 Personal history of other endocrine, nutritional and metabolic disease: Secondary | ICD-10-CM | POA: Diagnosis not present

## 2017-09-15 LAB — VITAMIN D 25 HYDROXY (VIT D DEFICIENCY, FRACTURES): VITD: 36.81 ng/mL (ref 30.00–100.00)

## 2017-09-15 LAB — ALKALINE PHOSPHATASE: ALK PHOS: 103 U/L (ref 39–117)

## 2017-09-15 NOTE — Progress Notes (Signed)
Patient ID: Whitney Fisher, female   DOB: 28-Jan-1956, 62 y.o.   MRN: [id number]   HPI  Whitney Fisher is a 62 y.o.-year-old female, returning for f/u for elevated alkaline phosphatase w/o elevated GGT. Last visit 1 year ago. PCP: Dr Silvestre Mesi.  She eliminated starches and other carbs from diet >> lost a lot of weight in last year.   She has multiple stressors in her life.  She sleeps poorly.  Does not have energy to exercise.  Reviewed and addended history: Pt was found to have a high alkaline phosphatase in 01/2016.  Normal GGT and other LFTs.   No history of Paget disease. She does have osteoporosis - BMD was very low on DXA scan from 04/25/2017. She was started on Fosamax.  I reviewed patient's pertinent labs: Component     Latest Ref Rng & Units 02/10/2016 02/12/2016 02/16/2016 09/15/2016  Total Bilirubin     0.2 - 1.2 mg/dL 0.4 0.3    Alkaline Phosphatase     39 - 117 U/L 158 (H) 157 (H)  129 (H)  AST     0 - 37 U/L 15 15    ALT     0 - 35 U/L 13 13    Total Protein     6.0 - 8.3 g/dL 7.7 7.3    Albumin     3.5 - 5.2 g/dL 4.1 4.1    GGT     7 - 51 U/L   27    Latest alk phos has been a little higher: Component     Latest Ref Rng & Units 10/13/2016  Total Bilirubin     0.2 - 1.2 mg/dL 0.4  Alkaline Phosphatase     39 - 117 U/L 132 (H)  AST     0 - 37 U/L 15  ALT     0 - 35 U/L 17  Total Protein     6.0 - 8.3 g/dL 7.8  Albumin     3.5 - 5.2 g/dL 4.3   Previously, calcium, PTH, and PTH related protein were normal: Component     Latest Ref Rng & Units 03/14/2016 03/14/2016         2:14 PM  2:14 PM  Calcium     8.7 - 10.2 mg/dL  9.2  PTH     15 - 65 pg/mL  47  PTH-Related Protein (PTH-RP)     14 - 27 pg/mL 13 (L)    She has a history of vitamin D deficiency.  Most recent vitamin D levels have been normal. Lab Results  Component Value Date   VD25OH 40.31 09/15/2016   VD25OH 31.40 02/18/2016   + h/o osteoporosis.  She has a history of wrist fracture  and metacarpal fracture in 2012.  She has a history of ulcerative proctitis, with normal inflammatory markers: Component     Latest Ref Rng & Units 02/16/2016  Sed Rate     0 - 30 mm/hr 8  CRP     0.5 - 20.0 mg/dL 0.1 (L)   No CKD. Last BUN/Cr: Lab Results  Component Value Date   BUN 8 04/27/2017   CREATININE 0.92 04/27/2017   No thyrotoxicosis. Last TSH: Lab Results  Component Value Date   TSH 3.07 10/13/2016   Of note, pt was on Tamoxifen x 5 years  - finished course 6 years ago. She is on Cortifoam for ulcerative proctitis.  She also has a history of a thyroid nodule. This was biopsied on  03/11/2016 >> benign.  ROS: Constitutional:+ weight loss but also had some weight gain over the holidays, + fatigue, + hot flashes Eyes: + Blurry vision, no xerophthalmia ENT: no sore throat, no nodules palpated in throat, no dysphagia, no odynophagia, no hoarseness Cardiovascular: no CP/no SOB/no palpitations/no leg swelling Respiratory: no cough/no SOB/no wheezing Gastrointestinal: no N/no V/no D/+ C/no acid reflux Musculoskeletal: + Muscle aches/+ joint aches (knees, wrists) Skin: no rashes, no hair loss Neurological: no tremors/no numbness/no tingling/no dizziness, + headache  I reviewed pt's medications, allergies, PMH, social hx, family hx, and changes were documented in the history of present illness. Otherwise, unchanged from my initial visit note.  Past Medical History:  Diagnosis Date  . B12 deficiency   . Breast cancer ([place])    Right Breast  . Cavernous hemangioma    x2  . Hyperlipidemia   . Hypertension   . Low serum vitamin D   . Migraine   . Narcolepsy   . S/P radiation therapy Age [age]  . Thyroid disease   . TIA (transient ischemic attack)   . Ulcerative colitis ([place])   . Visual floaters    Past Surgical History:  Procedure Laterality Date  . ABDOMINAL HERNIA REPAIR    . ACHILLES TENDON LENGTHENING Left   . ANTERIOR CRUCIATE LIGAMENT REPAIR Right   .  BIOPSY THYROID     x2  . BREAST LUMPECTOMY Right Age 65  . BUNIONECTOMY Bilateral   . CARPAL TUNNEL RELEASE Bilateral   . CARPAL TUNNEL WITH CUBITAL TUNNEL Right   . [place]  . DILATION AND CURETTAGE OF UTERUS     After 1st miscarriage  . HAMMER TOE SURGERY Bilateral   . HAND SURGERY Right    Multiple Right & Left tfcc  . LIPOMA EXCISION  Age 82  . LUMBAR LAMINECTOMY  Age 43-30  . NECK SURGERY     Social History   Social History  . Marital status: Married    Spouse name: N/A  . Number of children: 2   Occupational History  . Disabled    Social History Main Topics  . Smoking status: Former Smoker    Types: Cigarettes    Quit date: 08/22/1978  . Smokeless tobacco: Never Used  . Alcohol use 0.0 oz/week     Comment: occ  . Drug use: No   Current Outpatient Medications on File Prior to Visit  Medication Sig Dispense Refill  . alendronate (FOSAMAX) 70 MG tablet Take 1 tablet (70 mg total) by mouth every 7 (seven) days. Take with a full glass of water on an empty stomach. 4 tablet 11  . amLODipine (NORVASC) 5 MG tablet TAKE 1 TABLET(5 MG) BY MOUTH DAILY 90 tablet 1  . aspirin 81 MG tablet Take 81 mg by mouth daily.    Marland Kitchen atorvastatin (LIPITOR) 10 MG tablet Take 1 tablet (10 mg total) by mouth daily. 90 tablet 3  . mesalamine (LIALDA) 1.2 g EC tablet Take 2 tablets (2.4 g total) by mouth daily with breakfast. (Patient not taking: Reported on 04/13/2017) 120 tablet 3  . mesalamine (ROWASA) 4 g enema Every night at bedtime (Patient not taking: Reported on 04/13/2017) 14 Bottle 3  . ondansetron (ZOFRAN) 4 MG tablet Take 1 tablet (4 mg total) by mouth every 6 (six) hours as needed for nausea or vomiting. (Patient not taking: Reported on 04/13/2017) 30 tablet 1  . topiramate (TOPAMAX) 100 MG tablet TAKE 1 TABLET(100 MG) BY MOUTH TWICE DAILY  60 tablet 11  . traMADol (ULTRAM) 50 MG tablet Take 1 tablet (50 mg total) by mouth every 12 (twelve) hours as needed for moderate  pain or severe pain. Last Phoned in & Filled on 02/12/16 #30 dispensed 20 tablet 0  . venlafaxine XR (EFFEXOR-XR) 75 MG 24 hr capsule Take 1 capsule (75 mg total) by mouth daily with breakfast. 90 capsule 0   No current facility-administered medications on file prior to visit.    Allergies  Allergen Reactions  . Codeine Other (See Comments)    Dizziness  . Adhesive [Tape] Rash  . Sulfa Antibiotics Rash   Family History  Problem Relation Age of Onset  . Breast cancer Mother        Living  . Arthritis Mother   . Osteoporosis Mother   . Migraines Mother   . Cancer Mother   . Glaucoma Maternal Grandmother   . Hypertension Father 31       Deceased  . Hyperlipidemia Father   . Parkinson's disease Father   . Parkinsonism Father   . Migraines Sister   . Colon cancer Neg Hx     PE: BP 130/80 (BP Location: Left Arm, Patient Position: Sitting, Cuff Size: Normal)   Pulse 64   Temp 97.7 F (36.5 C) (Oral)   Ht _0  (1.6 m)   Wt 128 lb (58.1 kg)   BMI 22.67 kg/m  Wt Readings from Last 3 Encounters:  09/15/17 128 lb (58.1 kg)  04/13/17 135 lb 6.4 oz (61.4 kg)  03/01/17 145 lb (65.8 kg)   Constitutional: Normal weight, in NAD Eyes: PERRLA, EOMI, no exophthalmos ENT: moist mucous membranes, no thyromegaly, no cervical lymphadenopathy Cardiovascular: RRR, No MRG Respiratory: CTA B Gastrointestinal: abdomen soft, NT, ND, BS+ Musculoskeletal: no deformities, strength intact in all 4 Skin: moist, warm, no rashes Neurological: no tremor with outstretched hands, DTR normal in all 4  Assessment: 1. High alkaline phosphatase  2.  History of vitamin D deficiency   PLAN: 1.  High alkaline phosphatase - Patient with idiopathic slightly high alk phos - reviewed previous levels, at last check, her alk phos was slightly higher, but we discussed that this is within error, so we will need to recheck another level today - Again reviewed possible etiologies of isolated increased A  phos:  After a fattier meal (we will repeat her alkaline phosphatase level today - she is fasting)  Vitamin D deficiency >> osteomalacia (vitamin D level was normal at last check-we will recheck today).  She is not on vitamin D supplements.    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  History of vitamin D deficiency  - reviewed most recent level from a year ago and this was normal. - We will recheck level today  Office Visit on 09/15/2017  Component Date Value Ref Range  Status  . Alkaline Phosphatase 09/15/2017 103  39 - 117 U/L Final  . VITD 09/15/2017 36.81  30.00 - 100.00 ng/mL Final   Alkaline phosphatase now normal.  Will recheck at next visit, and if normal, she may continue to follow with PCP. Vitamin D also normal.  Philemon Kingdom, MD PhD [facility]

## 2017-09-15 NOTE — Patient Instructions (Signed)
Please stop at the lab.  Please come back for a follow-up appointment in 1 year.  

## 2017-09-16 ENCOUNTER — Encounter: Payer: Self-pay | Admitting: Internal Medicine

## 2017-09-16 ENCOUNTER — Other Ambulatory Visit: Payer: Self-pay | Admitting: Physician Assistant

## 2017-09-18 ENCOUNTER — Other Ambulatory Visit: Payer: Self-pay | Admitting: Family Medicine

## 2017-09-18 ENCOUNTER — Encounter: Payer: Self-pay | Admitting: Family Medicine

## 2017-09-18 DIAGNOSIS — G43709 Chronic migraine without aura, not intractable, without status migrainosus: Secondary | ICD-10-CM

## 2017-09-18 MED ORDER — TRAMADOL HCL 50 MG PO TABS: 50.0000 mg | ORAL_TABLET | Freq: Two times a day (BID) | ORAL | 0 refills | Status: DC | PRN

## 2017-09-18 MED ORDER — TRAMADOL HCL 50 MG PO TABS: 50.0000 mg | ORAL_TABLET | Freq: Two times a day (BID) | ORAL | 0 refills | Status: AC | PRN

## 2017-09-18 NOTE — Progress Notes (Signed)
N[facility] is not working right now.  Pt requested this several days ago. Will go ahead and refill, it does not appear that she has filled it on over a year

## 2017-09-21 ENCOUNTER — Other Ambulatory Visit: Payer: Self-pay | Admitting: Emergency Medicine

## 2017-09-21 DIAGNOSIS — E782 Mixed hyperlipidemia: Secondary | ICD-10-CM

## 2017-09-21 MED ORDER — AMLODIPINE BESYLATE 5 MG PO TABS: ORAL_TABLET | ORAL | 0 refills | Status: DC

## 2017-09-21 MED ORDER — ATORVASTATIN CALCIUM 10 MG PO TABS: 10.0000 mg | ORAL_TABLET | Freq: Every day | ORAL | 3 refills | Status: AC

## 2017-11-30 ENCOUNTER — Ambulatory Visit: Payer: Medicare Other | Admitting: Neurology

## 2017-12-12 ENCOUNTER — Other Ambulatory Visit: Payer: Self-pay | Admitting: Neurology

## 2017-12-12 ENCOUNTER — Other Ambulatory Visit: Payer: Self-pay | Admitting: Family Medicine

## 2017-12-19 ENCOUNTER — Other Ambulatory Visit: Payer: Self-pay | Admitting: Family Medicine

## 2018-02-14 ENCOUNTER — Telehealth: Payer: Self-pay | Admitting: *Deleted

## 2018-02-14 NOTE — Telephone Encounter (Signed)
Received request for Medical Records from Kelli Hope, D.O., FAS[facility]/[place], [place]; forwarded to [place] for email/scan/SLS 06/26

## 2018-07-07 ENCOUNTER — Other Ambulatory Visit: Payer: Self-pay | Admitting: Family Medicine

## 2018-09-14 ENCOUNTER — Ambulatory Visit: Payer: Medicare Other | Admitting: Internal Medicine
